# Patient Record
Sex: Male | Born: 1940 | Race: White | Hispanic: No | Marital: Married | Smoking: Never smoker
Health system: Southern US, Community
[De-identification: ages and names within clinical notes are randomized; demographics above are authoritative.]

## PROBLEM LIST (undated history)

## (undated) DIAGNOSIS — G4730 Sleep apnea, unspecified: Secondary | ICD-10-CM

## (undated) DIAGNOSIS — I10 Essential (primary) hypertension: Secondary | ICD-10-CM

## (undated) DIAGNOSIS — M1990 Unspecified osteoarthritis, unspecified site: Secondary | ICD-10-CM

## (undated) HISTORY — PX: HERNIA REPAIR: SHX51

## (undated) HISTORY — PX: INGUINAL HERNIA REPAIR: SUR1180

## (undated) HISTORY — PX: LEG SURGERY: SHX1003

---

## 2018-09-14 ENCOUNTER — Other Ambulatory Visit: Payer: Self-pay | Admitting: Neurosurgery

## 2018-10-18 ENCOUNTER — Other Ambulatory Visit (HOSPITAL_COMMUNITY)
Admission: RE | Admit: 2018-10-18 | Discharge: 2018-10-18 | Disposition: A | Discharge status: Home / Self Care | Payer: Medicare Other | Source: Ambulatory Visit | Attending: Neurosurgery | Admitting: Neurosurgery

## 2018-10-18 ENCOUNTER — Other Ambulatory Visit: Payer: Self-pay

## 2018-10-18 ENCOUNTER — Encounter (HOSPITAL_COMMUNITY): Payer: Self-pay

## 2018-10-18 LAB — SARS CORONAVIRUS 2 (TAT 6-24 HRS): SARS Coronavirus 2: NEGATIVE

## 2018-10-18 NOTE — Anesthesia Preprocedure Evaluation (Addendum)
Anesthesia Evaluation  Patient identified by MRN, date of birth, ID band Patient awake    Reviewed: Allergy & Precautions, NPO status , Patient's Chart, lab work & pertinent test results  Airway Mallampati: II       Dental  (+) Partial Lower   Pulmonary sleep apnea and Continuous Positive Airway Pressure Ventilation ,    Pulmonary exam normal        Cardiovascular hypertension, Pt. on home beta blockers Normal cardiovascular exam     Neuro/Psych negative neurological ROS  negative psych ROS   GI/Hepatic negative GI ROS, Neg liver ROS,   Endo/Other  negative endocrine ROS  Renal/GU negative Renal ROS     Musculoskeletal negative musculoskeletal ROS (+)   Abdominal (+) + obese,   Peds  Hematology negative hematology ROS (+)   Anesthesia Other Findings Lumbar stenosis with neurogenic claudication  Reproductive/Obstetrics                            Anesthesia Physical Anesthesia Plan  ASA: III  Anesthesia Plan: General   Post-op Pain Management:    Induction: Intravenous  PONV Risk Score and Plan: 3 and Ondansetron, Dexamethasone and Treatment may vary due to age or medical condition  Airway Management Planned: Oral ETT  Additional Equipment:   Intra-op Plan:   Post-operative Plan: Extubation in OR  Informed Consent: I have reviewed the patients History and Physical, chart, labs and discussed the procedure including the risks, benefits and alternatives for the proposed anesthesia with the patient or authorized representative who has indicated his/her understanding and acceptance.     Dental advisory given  Plan Discussed with: CRNA  Anesthesia Plan Comments:        Anesthesia Quick Evaluation

## 2018-10-18 NOTE — Progress Notes (Signed)
PCP - Dr. Eber Jones  Cardiologist - Denies  Chest x-ray - Denies  EKG - 10/19/2018 (DOS)  Stress Test - Denies  ECHO - Denies  Cardiac Cath - Denies  AICD-na PM-na LOOP-na  Sleep Study - Yes- Positive CPAP - Yes  LABS- 10/19/2018: CBC, BMP, T/S 10/18/2018: COVID  ASA- LD-9/4  ERAS- NO  Anesthesia- No  Pt denies having chest pain, sob, or fever during the pre-op phone call. All instructions explained to the pt, with a verbal understanding of the material including: as of today, stop taking all Aspirin (unless instructed by your doctor) and Other Aspirin containing products, Vitamins, Fish oils, and Herbal medications. Also stop all NSAIDS i.e. Advil, Ibuprofen, Motrin, Aleve, Anaprox, Naproxen, Celebrex, BC, Goody Powders, and all Supplements. Pt also instructed to self quarantine after being tested for COVID-19. The opportunity to ask questions was provided.   Coronavirus Screening  Have you experienced the following symptoms:  Cough yes/no: No Fever (>100.33F)  yes/no: No Runny nose yes/no: No Sore throat yes/no: No Difficulty breathing/shortness of breath  yes/no: No  Have you or a family member traveled in the last 14 days and where? yes/no: No   If the patient indicates "YES" to the above questions, their PAT will be rescheduled to limit the exposure to others and, the surgeon will be notified. THE PATIENT WILL NEED TO BE ASYMPTOMATIC FOR 14 DAYS.   If the patient is not experiencing any of these symptoms, the PAT nurse will instruct them to NOT bring anyone with them to their appointment since they may have these symptoms or traveled as well.   Please remind your patients and families that hospital visitation restrictions are in effect and the importance of the restrictions.

## 2018-10-19 ENCOUNTER — Inpatient Hospital Stay (HOSPITAL_COMMUNITY): Payer: Medicare Other

## 2018-10-19 ENCOUNTER — Inpatient Hospital Stay (HOSPITAL_COMMUNITY)
Admission: RE | Admit: 2018-10-19 | Discharge: 2018-10-21 | DRG: 455 | Disposition: A | Discharge status: Home / Self Care | Payer: Medicare Other | Attending: Neurosurgery | Admitting: Neurosurgery

## 2018-10-19 ENCOUNTER — Encounter (HOSPITAL_COMMUNITY)
Admission: RE | Disposition: A | Discharge status: Home / Self Care | Payer: Self-pay | Source: Home / Self Care | Attending: Neurosurgery

## 2018-10-19 ENCOUNTER — Other Ambulatory Visit: Payer: Self-pay

## 2018-10-19 ENCOUNTER — Encounter (HOSPITAL_COMMUNITY): Payer: Self-pay | Admitting: *Deleted

## 2018-10-19 ENCOUNTER — Inpatient Hospital Stay (HOSPITAL_COMMUNITY): Payer: Medicare Other | Admitting: Anesthesiology

## 2018-10-19 DIAGNOSIS — M549 Dorsalgia, unspecified: Secondary | ICD-10-CM | POA: Diagnosis present

## 2018-10-19 DIAGNOSIS — G4730 Sleep apnea, unspecified: Secondary | ICD-10-CM | POA: Diagnosis not present

## 2018-10-19 DIAGNOSIS — M4316 Spondylolisthesis, lumbar region: Secondary | ICD-10-CM | POA: Diagnosis not present

## 2018-10-19 DIAGNOSIS — Z20828 Contact with and (suspected) exposure to other viral communicable diseases: Secondary | ICD-10-CM | POA: Diagnosis present

## 2018-10-19 DIAGNOSIS — I10 Essential (primary) hypertension: Secondary | ICD-10-CM | POA: Diagnosis present

## 2018-10-19 DIAGNOSIS — Z419 Encounter for procedure for purposes other than remedying health state, unspecified: Secondary | ICD-10-CM

## 2018-10-19 DIAGNOSIS — M48062 Spinal stenosis, lumbar region with neurogenic claudication: Principal | ICD-10-CM | POA: Diagnosis present

## 2018-10-19 DIAGNOSIS — M5416 Radiculopathy, lumbar region: Secondary | ICD-10-CM | POA: Diagnosis present

## 2018-10-19 HISTORY — DX: Sleep apnea, unspecified: G47.30

## 2018-10-19 HISTORY — DX: Essential (primary) hypertension: I10

## 2018-10-19 HISTORY — DX: Unspecified osteoarthritis, unspecified site: M19.90

## 2018-10-19 LAB — BASIC METABOLIC PANEL
Anion gap: 12 (ref 5–15)
BUN: 27 mg/dL — ABNORMAL HIGH (ref 8–23)
CO2: 20 mmol/L — ABNORMAL LOW (ref 22–32)
Calcium: 8.9 mg/dL (ref 8.9–10.3)
Chloride: 108 mmol/L (ref 98–111)
Creatinine, Ser: 1.16 mg/dL (ref 0.61–1.24)
GFR calc Af Amer: >60 mL/min (ref >60)
GFR calc non Af Amer: 60 mL/min — ABNORMAL LOW (ref >60)
Glucose, Bld: 122 mg/dL — ABNORMAL HIGH (ref 70–99)
Potassium: 3.9 mmol/L (ref 3.5–5.1)
Sodium: 140 mmol/L (ref 135–145)

## 2018-10-19 LAB — TYPE AND SCREEN
ABO/RH(D): O POS
Antibody Screen: NEGATIVE

## 2018-10-19 LAB — CBC
HCT: 51.6 % (ref 39.0–52.0)
Hemoglobin: 16.7 g/dL (ref 13.0–17.0)
MCH: 30.7 pg (ref 26.0–34.0)
MCHC: 32.4 g/dL (ref 30.0–36.0)
MCV: 94.9 fL (ref 80.0–100.0)
Platelets: 120 10*3/uL — ABNORMAL LOW (ref 150–400)
RBC: 5.44 MIL/uL (ref 4.22–5.81)
RDW: 13.8 % (ref 11.5–15.5)
WBC: 5.8 10*3/uL (ref 4.0–10.5)
nRBC: 0 % (ref 0.0–0.2)

## 2018-10-19 LAB — ABO/RH: ABO/RH(D): O POS

## 2018-10-19 SURGERY — POSTERIOR LUMBAR FUSION 1 LEVEL
Anesthesia: General | Site: Spine Lumbar

## 2018-10-19 MED ORDER — ACETAMINOPHEN 500 MG PO TABS: 1000.0000 mg | ORAL_TABLET | Freq: Four times a day (QID) | ORAL | Status: DC | PRN

## 2018-10-19 MED ORDER — PRESERVISION AREDS 2 PO CAPS: 1.0000 | ORAL_CAPSULE | Freq: Every day | ORAL | Status: DC

## 2018-10-19 MED ORDER — OXYCODONE HCL 5 MG PO TABS
5.0000 mg | ORAL_TABLET | ORAL | Status: DC | PRN
  Administered 2018-10-19 – 2018-10-21 (×9): 10 mg via ORAL
  Filled 2018-10-19 (×9): qty 2

## 2018-10-19 MED ORDER — BUPIVACAINE LIPOSOME 1.3 % IJ SUSP
INTRAMUSCULAR | Status: DC | PRN
  Administered 2018-10-19: 20 mL

## 2018-10-19 MED ORDER — ROCURONIUM BROMIDE 10 MG/ML (PF) SYRINGE
PREFILLED_SYRINGE | INTRAVENOUS | Status: AC
  Filled 2018-10-19: qty 10

## 2018-10-19 MED ORDER — EPHEDRINE SULFATE-NACL 50-0.9 MG/10ML-% IV SOSY
PREFILLED_SYRINGE | INTRAVENOUS | Status: DC | PRN
  Administered 2018-10-19: 10 mg via INTRAVENOUS
  Administered 2018-10-19 (×3): 5 mg via INTRAVENOUS

## 2018-10-19 MED ORDER — SODIUM CHLORIDE 0.9% FLUSH
3.0000 mL | Freq: Two times a day (BID) | INTRAVENOUS | Status: DC
  Administered 2018-10-19 (×2): 3 mL via INTRAVENOUS

## 2018-10-19 MED ORDER — FENTANYL CITRATE (PF) 100 MCG/2ML IJ SOLN
INTRAMUSCULAR | Status: AC
  Filled 2018-10-19: qty 2

## 2018-10-19 MED ORDER — KCL IN DEXTROSE-NACL 20-5-0.45 MEQ/L-%-% IV SOLN: INTRAVENOUS | Status: DC

## 2018-10-19 MED ORDER — BUPIVACAINE HCL (PF) 0.5 % IJ SOLN
INTRAMUSCULAR | Status: AC
  Filled 2018-10-19: qty 30

## 2018-10-19 MED ORDER — PROSIGHT PO TABS
1.0000 | ORAL_TABLET | Freq: Every day | ORAL | Status: DC
  Administered 2018-10-20: 1 via ORAL
  Filled 2018-10-19 (×2): qty 1

## 2018-10-19 MED ORDER — ONDANSETRON HCL 4 MG PO TABS: 4.0000 mg | ORAL_TABLET | Freq: Four times a day (QID) | ORAL | Status: DC | PRN

## 2018-10-19 MED ORDER — FENTANYL CITRATE (PF) 250 MCG/5ML IJ SOLN
INTRAMUSCULAR | Status: AC
  Filled 2018-10-19: qty 5

## 2018-10-19 MED ORDER — THROMBIN 5000 UNITS EX SOLR
OROMUCOSAL | Status: DC | PRN
  Administered 2018-10-19: 09:00:00 5 mL via TOPICAL

## 2018-10-19 MED ORDER — ASPIRIN 81 MG PO CHEW
81.0000 mg | CHEWABLE_TABLET | Freq: Every day | ORAL | Status: DC
  Administered 2018-10-20: 81 mg via ORAL
  Filled 2018-10-19: qty 1

## 2018-10-19 MED ORDER — THROMBIN 20000 UNITS EX SOLR
CUTANEOUS | Status: AC
  Filled 2018-10-19: qty 20000

## 2018-10-19 MED ORDER — TESTOSTERONE CYPIONATE 200 MG/ML IJ SOLN: 100.0000 mg | INTRAMUSCULAR | Status: DC

## 2018-10-19 MED ORDER — FLEET ENEMA 7-19 GM/118ML RE ENEM: 1.0000 | ENEMA | Freq: Once | RECTAL | Status: DC | PRN

## 2018-10-19 MED ORDER — ZOLPIDEM TARTRATE 5 MG PO TABS: 5.0000 mg | ORAL_TABLET | Freq: Every evening | ORAL | Status: DC | PRN

## 2018-10-19 MED ORDER — BUPIVACAINE HCL (PF) 0.5 % IJ SOLN
INTRAMUSCULAR | Status: DC | PRN
  Administered 2018-10-19: 5 mL

## 2018-10-19 MED ORDER — BUPIVACAINE LIPOSOME 1.3 % IJ SUSP
20.0000 mL | Freq: Once | INTRAMUSCULAR | Status: DC
  Filled 2018-10-19: qty 20

## 2018-10-19 MED ORDER — ADULT MULTIVITAMIN W/MINERALS CH: 1.0000 | ORAL_TABLET | Freq: Every day | ORAL | Status: DC

## 2018-10-19 MED ORDER — ACETAMINOPHEN 500 MG PO TABS
1000.0000 mg | ORAL_TABLET | Freq: Once | ORAL | Status: AC
  Administered 2018-10-19: 1000 mg via ORAL
  Filled 2018-10-19: qty 2

## 2018-10-19 MED ORDER — METHOCARBAMOL 1000 MG/10ML IJ SOLN
500.0000 mg | Freq: Four times a day (QID) | INTRAVENOUS | Status: DC | PRN
  Filled 2018-10-19: qty 5

## 2018-10-19 MED ORDER — ROCURONIUM BROMIDE 10 MG/ML (PF) SYRINGE
PREFILLED_SYRINGE | INTRAVENOUS | Status: DC | PRN
  Administered 2018-10-19: 50 mg via INTRAVENOUS
  Administered 2018-10-19: 30 mg via INTRAVENOUS

## 2018-10-19 MED ORDER — METHOCARBAMOL 500 MG PO TABS
500.0000 mg | ORAL_TABLET | Freq: Four times a day (QID) | ORAL | Status: DC | PRN
  Administered 2018-10-19 – 2018-10-21 (×6): 500 mg via ORAL
  Filled 2018-10-19 (×5): qty 1

## 2018-10-19 MED ORDER — CEFAZOLIN SODIUM-DEXTROSE 2-4 GM/100ML-% IV SOLN
2.0000 g | Freq: Three times a day (TID) | INTRAVENOUS | Status: AC
  Administered 2018-10-19 (×2): 2 g via INTRAVENOUS
  Filled 2018-10-19 (×2): qty 100

## 2018-10-19 MED ORDER — CEFAZOLIN SODIUM-DEXTROSE 2-4 GM/100ML-% IV SOLN
2.0000 g | INTRAVENOUS | Status: AC
  Administered 2018-10-19: 2 g via INTRAVENOUS
  Filled 2018-10-19: qty 100

## 2018-10-19 MED ORDER — SODIUM CHLORIDE 0.9 % IV SOLN
INTRAVENOUS | Status: DC | PRN
  Administered 2018-10-19: 50 ug/min via INTRAVENOUS

## 2018-10-19 MED ORDER — SUGAMMADEX SODIUM 200 MG/2ML IV SOLN
INTRAVENOUS | Status: DC | PRN
  Administered 2018-10-19: 200 mg via INTRAVENOUS

## 2018-10-19 MED ORDER — ONDANSETRON HCL 4 MG/2ML IJ SOLN
INTRAMUSCULAR | Status: AC
  Filled 2018-10-19: qty 2

## 2018-10-19 MED ORDER — TAMSULOSIN HCL 0.4 MG PO CAPS
0.4000 mg | ORAL_CAPSULE | Freq: Every day | ORAL | Status: DC
  Administered 2018-10-19 – 2018-10-20 (×2): 0.4 mg via ORAL
  Filled 2018-10-19 (×2): qty 1

## 2018-10-19 MED ORDER — LIDOCAINE-EPINEPHRINE 1 %-1:100000 IJ SOLN
INTRAMUSCULAR | Status: DC | PRN
  Administered 2018-10-19: 5 mL

## 2018-10-19 MED ORDER — POLYETHYLENE GLYCOL 3350 17 G PO PACK: 17.0000 g | PACK | Freq: Every day | ORAL | Status: DC | PRN

## 2018-10-19 MED ORDER — ONDANSETRON HCL 4 MG/2ML IJ SOLN
INTRAMUSCULAR | Status: DC | PRN
  Administered 2018-10-19: 4 mg via INTRAVENOUS

## 2018-10-19 MED ORDER — HYDROCODONE-ACETAMINOPHEN 5-325 MG PO TABS
2.0000 | ORAL_TABLET | ORAL | Status: DC | PRN
  Administered 2018-10-19: 2 via ORAL
  Filled 2018-10-19: qty 2

## 2018-10-19 MED ORDER — MENTHOL 3 MG MT LOZG: 1.0000 | LOZENGE | OROMUCOSAL | Status: DC | PRN

## 2018-10-19 MED ORDER — THROMBIN 20000 UNITS EX SOLR
CUTANEOUS | Status: DC | PRN
  Administered 2018-10-19: 20 mL via TOPICAL

## 2018-10-19 MED ORDER — SODIUM CHLORIDE 0.9 % IV SOLN
250.0000 mL | INTRAVENOUS | Status: DC
  Administered 2018-10-19: 16:00:00 250 mL via INTRAVENOUS

## 2018-10-19 MED ORDER — DEXAMETHASONE SODIUM PHOSPHATE 10 MG/ML IJ SOLN
INTRAMUSCULAR | Status: AC
  Filled 2018-10-19: qty 1

## 2018-10-19 MED ORDER — ALUM & MAG HYDROXIDE-SIMETH 200-200-20 MG/5ML PO SUSP: 30.0000 mL | Freq: Four times a day (QID) | ORAL | Status: DC | PRN

## 2018-10-19 MED ORDER — DOCUSATE SODIUM 100 MG PO CAPS
100.0000 mg | ORAL_CAPSULE | Freq: Two times a day (BID) | ORAL | Status: DC
  Administered 2018-10-19 – 2018-10-20 (×3): 100 mg via ORAL
  Filled 2018-10-19 (×3): qty 1

## 2018-10-19 MED ORDER — VITAMIN D (ERGOCALCIFEROL) 1.25 MG (50000 UNIT) PO CAPS: 50000.0000 [IU] | ORAL_CAPSULE | ORAL | Status: DC

## 2018-10-19 MED ORDER — DEXAMETHASONE SODIUM PHOSPHATE 10 MG/ML IJ SOLN
INTRAMUSCULAR | Status: DC | PRN
  Administered 2018-10-19: 10 mg via INTRAVENOUS

## 2018-10-19 MED ORDER — LIDOCAINE-EPINEPHRINE 1 %-1:100000 IJ SOLN
INTRAMUSCULAR | Status: AC
  Filled 2018-10-19: qty 1

## 2018-10-19 MED ORDER — LACTATED RINGERS IV SOLN
INTRAVENOUS | Status: DC | PRN
  Administered 2018-10-19 (×2): via INTRAVENOUS

## 2018-10-19 MED ORDER — PANTOPRAZOLE SODIUM 40 MG IV SOLR: 40.0000 mg | Freq: Every day | INTRAVENOUS | Status: DC

## 2018-10-19 MED ORDER — ONDANSETRON HCL 4 MG/2ML IJ SOLN: 4.0000 mg | Freq: Once | INTRAMUSCULAR | Status: DC | PRN

## 2018-10-19 MED ORDER — OXYCODONE HCL 5 MG PO TABS
5.0000 mg | ORAL_TABLET | ORAL | Status: DC | PRN
  Administered 2018-10-19: 12:00:00 5 mg via ORAL

## 2018-10-19 MED ORDER — PANTOPRAZOLE SODIUM 40 MG PO TBEC
40.0000 mg | DELAYED_RELEASE_TABLET | Freq: Every day | ORAL | Status: DC
  Administered 2018-10-19 – 2018-10-20 (×2): 40 mg via ORAL
  Filled 2018-10-19 (×2): qty 1

## 2018-10-19 MED ORDER — OXYCODONE HCL 5 MG PO TABS
ORAL_TABLET | ORAL | Status: AC
  Filled 2018-10-19: qty 1

## 2018-10-19 MED ORDER — PHENOL 1.4 % MT LIQD: 1.0000 | OROMUCOSAL | Status: DC | PRN

## 2018-10-19 MED ORDER — ACETAMINOPHEN 325 MG PO TABS: 650.0000 mg | ORAL_TABLET | ORAL | Status: DC | PRN

## 2018-10-19 MED ORDER — PROPOFOL 10 MG/ML IV BOLUS
INTRAVENOUS | Status: AC
  Filled 2018-10-19: qty 20

## 2018-10-19 MED ORDER — BISACODYL 10 MG RE SUPP: 10.0000 mg | Freq: Every day | RECTAL | Status: DC | PRN

## 2018-10-19 MED ORDER — CHLORHEXIDINE GLUCONATE CLOTH 2 % EX PADS: 6.0000 | MEDICATED_PAD | Freq: Once | CUTANEOUS | Status: DC

## 2018-10-19 MED ORDER — SODIUM CHLORIDE 0.9% FLUSH: 3.0000 mL | INTRAVENOUS | Status: DC | PRN

## 2018-10-19 MED ORDER — METOPROLOL TARTRATE 25 MG PO TABS
100.0000 mg | ORAL_TABLET | Freq: Two times a day (BID) | ORAL | Status: DC
  Administered 2018-10-19 – 2018-10-20 (×3): 100 mg via ORAL
  Filled 2018-10-19 (×2): qty 4

## 2018-10-19 MED ORDER — ONDANSETRON HCL 4 MG/2ML IJ SOLN: 4.0000 mg | Freq: Four times a day (QID) | INTRAMUSCULAR | Status: DC | PRN

## 2018-10-19 MED ORDER — HYDROCODONE-ACETAMINOPHEN 5-325 MG PO TABS: 1.0000 | ORAL_TABLET | ORAL | Status: DC | PRN

## 2018-10-19 MED ORDER — PROPOFOL 10 MG/ML IV BOLUS
INTRAVENOUS | Status: DC | PRN
  Administered 2018-10-19: 150 mg via INTRAVENOUS

## 2018-10-19 MED ORDER — ACETAMINOPHEN 650 MG RE SUPP: 650.0000 mg | RECTAL | Status: DC | PRN

## 2018-10-19 MED ORDER — FENTANYL CITRATE (PF) 100 MCG/2ML IJ SOLN
25.0000 ug | INTRAMUSCULAR | Status: DC | PRN
  Administered 2018-10-19: 25 ug via INTRAVENOUS

## 2018-10-19 MED ORDER — METHOCARBAMOL 500 MG PO TABS
ORAL_TABLET | ORAL | Status: AC
  Filled 2018-10-19: qty 1

## 2018-10-19 MED ORDER — 0.9 % SODIUM CHLORIDE (POUR BTL) OPTIME
TOPICAL | Status: DC | PRN
  Administered 2018-10-19: 1000 mL

## 2018-10-19 MED ORDER — FENTANYL CITRATE (PF) 100 MCG/2ML IJ SOLN
INTRAMUSCULAR | Status: DC | PRN
  Administered 2018-10-19 (×2): 50 ug via INTRAVENOUS
  Administered 2018-10-19: 100 ug via INTRAVENOUS
  Administered 2018-10-19: 50 ug via INTRAVENOUS

## 2018-10-19 MED ORDER — LIDOCAINE 2% (20 MG/ML) 5 ML SYRINGE
INTRAMUSCULAR | Status: AC
  Filled 2018-10-19: qty 5

## 2018-10-19 MED ORDER — MORPHINE SULFATE (PF) 2 MG/ML IV SOLN
2.0000 mg | INTRAVENOUS | Status: DC | PRN
  Administered 2018-10-19: 2 mg via INTRAVENOUS
  Filled 2018-10-19: qty 1

## 2018-10-19 MED ORDER — LIDOCAINE 2% (20 MG/ML) 5 ML SYRINGE
INTRAMUSCULAR | Status: DC | PRN
  Administered 2018-10-19: 60 mg via INTRAVENOUS

## 2018-10-19 SURGICAL SUPPLY — 77 items
BASKET BONE COLLECTION (BASKET) ×2 IMPLANT
BLADE CLIPPER SURG (BLADE) ×2 IMPLANT
BONE CANC CHIPS 20CC PCAN1/4 (Bone Implant) ×2 IMPLANT
BUR MATCHSTICK NEURO 3.0 LAGG (BURR) ×2 IMPLANT
BUR PRECISION FLUTE 5.0 (BURR) ×2 IMPLANT
CAGE COROENT LG 10X9X23-12 (Cage) ×2 IMPLANT
CANISTER SUCT 3000ML PPV (MISCELLANEOUS) ×2 IMPLANT
CARTRIDGE OIL MAESTRO DRILL (MISCELLANEOUS) ×1 IMPLANT
CHIPS CANC BONE 20CC PCAN1/4 (Bone Implant) ×1 IMPLANT
CONT SPEC 4OZ CLIKSEAL STRL BL (MISCELLANEOUS) ×2 IMPLANT
COVER BACK TABLE 60X90IN (DRAPES) ×2 IMPLANT
COVER WAND RF STERILE (DRAPES) IMPLANT
DECANTER SPIKE VIAL GLASS SM (MISCELLANEOUS) ×2 IMPLANT
DERMABOND ADVANCED (GAUZE/BANDAGES/DRESSINGS) ×1
DERMABOND ADVANCED .7 DNX12 (GAUZE/BANDAGES/DRESSINGS) ×1 IMPLANT
DIFFUSER DRILL AIR PNEUMATIC (MISCELLANEOUS) ×2 IMPLANT
DRAPE C-ARM 42X72 X-RAY (DRAPES) ×2 IMPLANT
DRAPE C-ARMOR (DRAPES) ×2 IMPLANT
DRAPE LAPAROTOMY 100X72X124 (DRAPES) ×2 IMPLANT
DRAPE SURG 17X23 STRL (DRAPES) ×2 IMPLANT
DRSG OPSITE POSTOP 4X6 (GAUZE/BANDAGES/DRESSINGS) ×2 IMPLANT
DURAPREP 26ML APPLICATOR (WOUND CARE) ×2 IMPLANT
ELECT BLADE 4.0 EZ CLEAN MEGAD (MISCELLANEOUS) ×2
ELECT REM PT RETURN 9FT ADLT (ELECTROSURGICAL) ×2
ELECTRODE BLDE 4.0 EZ CLN MEGD (MISCELLANEOUS) ×1 IMPLANT
ELECTRODE REM PT RTRN 9FT ADLT (ELECTROSURGICAL) ×1 IMPLANT
EVACUATOR 1/8 PVC DRAIN (DRAIN) IMPLANT
GAUZE 4X4 16PLY RFD (DISPOSABLE) IMPLANT
GAUZE SPONGE 4X4 12PLY STRL (GAUZE/BANDAGES/DRESSINGS) IMPLANT
GLOVE BIO SURGEON STRL SZ7 (GLOVE) ×4 IMPLANT
GLOVE BIO SURGEON STRL SZ8 (GLOVE) ×4 IMPLANT
GLOVE BIOGEL PI IND STRL 7.5 (GLOVE) ×2 IMPLANT
GLOVE BIOGEL PI IND STRL 8 (GLOVE) ×2 IMPLANT
GLOVE BIOGEL PI IND STRL 8.5 (GLOVE) ×2 IMPLANT
GLOVE BIOGEL PI INDICATOR 7.5 (GLOVE) ×2
GLOVE BIOGEL PI INDICATOR 8 (GLOVE) ×2
GLOVE BIOGEL PI INDICATOR 8.5 (GLOVE) ×2
GLOVE ECLIPSE 8.0 STRL XLNG CF (GLOVE) ×4 IMPLANT
GLOVE EXAM NITRILE XL STR (GLOVE) IMPLANT
GLOVE SURG SS PI 7.0 STRL IVOR (GLOVE) ×10 IMPLANT
GOWN STRL REUS W/ TWL LRG LVL3 (GOWN DISPOSABLE) IMPLANT
GOWN STRL REUS W/ TWL XL LVL3 (GOWN DISPOSABLE) ×4 IMPLANT
GOWN STRL REUS W/TWL 2XL LVL3 (GOWN DISPOSABLE) ×4 IMPLANT
GOWN STRL REUS W/TWL LRG LVL3 (GOWN DISPOSABLE)
GOWN STRL REUS W/TWL XL LVL3 (GOWN DISPOSABLE) ×4
HEMOSTAT POWDER KIT SURGIFOAM (HEMOSTASIS) ×2 IMPLANT
KIT BASIN OR (CUSTOM PROCEDURE TRAY) ×2 IMPLANT
KIT POSITION SURG JACKSON T1 (MISCELLANEOUS) ×2 IMPLANT
KIT TURNOVER KIT B (KITS) ×2 IMPLANT
MILL MEDIUM DISP (BLADE) ×2 IMPLANT
NEEDLE HYPO 21X1.5 SAFETY (NEEDLE) ×2 IMPLANT
NEEDLE HYPO 25X1 1.5 SAFETY (NEEDLE) ×2 IMPLANT
NEEDLE SPNL 18GX3.5 QUINCKE PK (NEEDLE) ×2 IMPLANT
NS IRRIG 1000ML POUR BTL (IV SOLUTION) ×2 IMPLANT
OIL CARTRIDGE MAESTRO DRILL (MISCELLANEOUS) ×2
PACK LAMINECTOMY NEURO (CUSTOM PROCEDURE TRAY) ×2 IMPLANT
PAD ARMBOARD 7.5X6 YLW CONV (MISCELLANEOUS) ×8 IMPLANT
PATTIES SURGICAL .5 X.5 (GAUZE/BANDAGES/DRESSINGS) IMPLANT
PATTIES SURGICAL .5 X1 (DISPOSABLE) IMPLANT
PATTIES SURGICAL 1X1 (DISPOSABLE) IMPLANT
ROD RELINE-O LORD 5.5X40 (Rod) ×4 IMPLANT
SCREW LOCK RELINE 5.5 TULIP (Screw) ×8 IMPLANT
SCREW RELINE-O POLY 6.5X45 (Screw) ×4 IMPLANT
SCREW RELINE-O POLY 6.5X50MM (Screw) ×4 IMPLANT
SPONGE LAP 4X18 RFD (DISPOSABLE) IMPLANT
SPONGE SURGIFOAM ABS GEL 100 (HEMOSTASIS) ×2 IMPLANT
STAPLER SKIN PROX WIDE 3.9 (STAPLE) IMPLANT
SUT VIC AB 1 CT1 18XBRD ANBCTR (SUTURE) ×1 IMPLANT
SUT VIC AB 1 CT1 8-18 (SUTURE) ×1
SUT VIC AB 2-0 CT1 18 (SUTURE) ×2 IMPLANT
SUT VIC AB 3-0 SH 8-18 (SUTURE) ×4 IMPLANT
SYR 20ML LL LF (SYRINGE) ×2 IMPLANT
SYR 5ML LL (SYRINGE) IMPLANT
TOWEL GREEN STERILE (TOWEL DISPOSABLE) ×2 IMPLANT
TOWEL GREEN STERILE FF (TOWEL DISPOSABLE) ×2 IMPLANT
TRAY FOLEY MTR SLVR 16FR STAT (SET/KITS/TRAYS/PACK) ×2 IMPLANT
WATER STERILE IRR 1000ML POUR (IV SOLUTION) ×2 IMPLANT

## 2018-10-19 NOTE — Op Note (Signed)
10/19/2018  10:58 AM  PATIENT:  Spencer Clark  78 y.o. male  PRE-OPERATIVE DIAGNOSIS:  Lumbar stenosis with neurogenic claudication, spondylolisthesis, lumbago, radiculopathy L 45 level  POST-OPERATIVE DIAGNOSIS:  Lumbar stenosis with neurogenic claudication, spondylolisthesis, lumbago, radiculopathy L 45 level   PROCEDURE:  Procedure(s) with comments: Lumbar Four-Five Posterior lumbar interbody fusion (N/A) - posterior with PEEK cages, pedicle screw fixation, posterolateral arthrodesis  SURGEON:  Surgeon(s) and Role:    Erline Levine, MD - Primary    * Consuella Lose, MD - Assisting  PHYSICIAN ASSISTANT:   ASSISTANTS: Poteat, RN   ANESTHESIA:   general  EBL:  500 mL   BLOOD ADMINISTERED:none  DRAINS: none   LOCAL MEDICATIONS USED:  MARCAINE    and LIDOCAINE   SPECIMEN:  No Specimen  DISPOSITION OF SPECIMEN:  N/A  COUNTS:  YES  TOURNIQUET:  * No tourniquets in log *  DICTATION: Patient is 78 year old man with 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 He has a severe bilateral L5 radiculopathy. It was elected to take him to surgery for decompression and fusion at this level.   Procedure: Patient was placed in a prone position on the Opal table after smooth and uncomplicated induction of general endotracheal anesthesia. Preop C -arm was used to mark spinal anatomy.  His low back was prepped and draped in usual sterile fashion with betadine scrub and DuraPrep. Area of incision was infiltrated with local lidocaine. Incision was made to the lumbodorsal fascia was incised and exposure was performed of the L4 through L5 spinous processes laminae facet joint and transverse processes. Intraoperative x-ray was obtained which confirmed correct orientation. A total laminectomy of L4 was performed with disarticulation of the facet joints at this level and thorough decompression was performed of both L4 and L5 nerve roots along with the common dural tube. This  decompression was more involved than would be typical of that performed for PLIF alone and included painstaking dissection of adherent ligament compressing the thecal sac and wide decompression of all neural elements. A thorough discectomy was initially performed on the left with preparation of the endplates for grafting a trial spacer was placed this level and a thorough discectomy was performed on the right as well. Bone autograft was packed within the interspace bilaterally. Bilateral median 10 x 23 x 12 degree  peek cages were packed with bone autograft and was inserted the interspace and countersunk appropriately along with 10 cc of morselized bone autograft. The posterolateral region was extensively decorticated and pedicle probes were placed at L4 and L5 bilaterally. Intraoperative fluoroscopy confirmed correct orientationin the AP and lateral plane. 45 x 6.5 mm pedicle screws were placed at L5 bilaterally and 50 x 6.5 mm screws placed at L4 bilaterally final x-rays demonstrated well-positioned interbody grafts and pedicle screw fixation. A 40 mm lordotic rod was placed on the right and a 40 mm rod was placed on the left locked down in situ and the posterolateral region was packed with the remaining autograft and allograft (20 cc bilaterally). The wound was irrigated. Long-acting Marcaine was injected in the subcutaneous tissues. Fascia was closed with 1 Vicryl sutures skin edges were reapproximated 2 and 3-0 Vicryl sutures. The wound is dressed with Dermabond and the patient was extubated in the operating room and taken to recovery in stable satisfactory condition. He tolerated the operation well counts were correct at the end of the case.   PLAN OF CARE: Admit to inpatient   PATIENT DISPOSITION:  PACU -  hemodynamically stable.   Delay start of Pharmacological VTE agent (>24hrs) due to surgical blood loss or risk of bleeding: yes

## 2018-10-19 NOTE — H&P (Signed)
Patient ID:   [id number] Patient: Spencer Clark  Date of Birth: March 13, 1940 Visit Type: Office Visit   Date: 09/13/2018 01:00 PM Provider: Danae OrleansJoseph D. Venetia MaxonStern MD   This 78 year old male presents for back pain.  HISTORY OF PRESENT ILLNESS:  1.  back pain  Spencer Clark, 78 year old male, husband of patient Spencer Clark, visits for evaluation of back and right leg pain.  Patient recalls no injury, noting onset of back pain 2017. January 2020, he noticed increased low back pain and right leg pain with walking by June.  Sitting allows some relief of leg pain.  Patient complains of pain in the back of his right leg and right side of his lumbar. Patient reports his pain does not radiate into his foot. He repots he can walk 50 years before he is in pain. Patient reports no problems in his left leg. Patient has an unsteady gait. Patient does not complain of numbness or pain in his thighs. Patient reports his pain has worsened over the past few months.   Tylenol 1000 mg b.i.d. Celebrex 200 mg b.i.d. x10 years  History:  HTN, arthritis, low testosterone Surgical history:  Right lower leg fracture 1995  MRI 05/04/2018 and x-rays today on [facility]          PAST MEDICAL/SURGICAL HISTORY:   (Detailed)       Family History:  (Detailed)   Social History:  (Detailed) Preferred language is English.       MEDICATIONS: (added, continued or stopped this visit)   ALLERGIES:   REVIEW OF SYSTEMS   See scanned patient registration form, dated, signed and dated on   Review of Systems Details System Neg/Pos Details  Constitutional Negative Chills, Fatigue, Fever, Malaise, Night sweats, Weight gain and Weight loss.  ENMT Negative Ear drainage, Hearing loss, Nasal drainage, Otalgia, Sinus pressure and Sore throat.  Eyes Negative Eye discharge, Eye pain and Vision changes.  Respiratory Negative Chronic cough, Cough, Dyspnea, Known TB exposure and Wheezing.  Cardio Negative Chest pain,  Claudication, Edema and Irregular heartbeat/palpitations.  GI Negative Abdominal pain, Blood in stool, Change in stool pattern, Constipation, Decreased appetite, Diarrhea, Heartburn, Nausea and Vomiting.  GU Negative Dribbling, Dysuria, Erectile dysfunction, Hematuria, Polyuria (Genitourinary), Slow stream, Urinary frequency, Urinary incontinence and Urinary retention.  Endocrine Negative Cold intolerance, Heat intolerance, Polydipsia and Polyphagia.  Neuro Negative Dizziness, Extremity weakness, Gait disturbance, Headache, Memory impairment, Numbness in extremity, Seizures and Tremors.  Psych Negative Anxiety, Depression and Insomnia.  Integumentary Negative Brittle hair, Brittle nails, Change in shape/size of mole(s), Hair loss, Hirsutism, Hives, Pruritus, Rash and Skin lesion.  MS Positive Back pain, RLE pain.  Hema/Lymph Negative Easy bleeding, Easy bruising and Lymphadenopathy.  Allergic/Immuno Negative Contact allergy, Environmental allergies, Food allergies and Seasonal allergies.  Reproductive Negative Penile discharge and Sexual dysfunction.   PHYSICAL EXAM:   Vitals Date Temp F BP Pulse Ht In Wt Lb BMI BSA Pain Score  09/13/2018 97.5 186/108 69 66 236 38.09  10/10    PHYSICAL EXAM Details General Level of Distress: no acute distress Overall Appearance: normal  Head and Face  Right Left  Fundoscopic Exam:  normal normal    Cardiovascular Cardiac: regular rate and rhythm without murmur  Right Left  Carotid Pulses: normal normal  Respiratory Lungs: clear to auscultation  Neurological Orientation: normal Recent and Remote Memory: normal Attention Span and Concentration:   normal Language: normal Fund of Knowledge: normal  Right Left Sensation: normal normal Upper Extremity Coordination: normal normal  Lower Extremity Coordination: normal normal  Musculoskeletal Gait and Station: normal  Right Left Upper Extremity Muscle Strength: normal normal Lower  Extremity Muscle Strength: normal normal Upper Extremity Muscle Tone:  normal normal Lower Extremity Muscle Tone: normal normal   Motor Strength Upper and lower extremity motor strength was tested in the clinically pertinent muscles. Any abnormal findings will be noted below.   Right Left EHL: 4/5    Deep Tendon Reflexes  Right Left Biceps: normal normal Triceps: normal normal Brachioradialis: normal normal Patellar: normal normal Achilles: normal normal  Sensory Sensation was tested at L1 to S1. Any abnormal findings will be noted below.  Right Left L4: decreased   L5: decreased    Cranial Nerves II. Optic Nerve/Visual Fields: normal III. Oculomotor: normal IV. Trochlear: normal V. Trigeminal: normal VI. Abducens: normal VII. Facial: normal VIII. Acoustic/Vestibular: normal IX. Glossopharyngeal: normal X. Vagus: normal XI. Spinal Accessory: normal XII. Hypoglossal: normal  Motor and other Tests Lhermittes: negative Rhomberg: negative Pronator drift: absent     Right Left Hoffman's: normal normal Clonus: normal normal Babinski: normal normal SLR: positive at 30 degrees negative Patrick's Corky Sox): negative negative Toe Walk: normal normal Toe Lift: normal normal Heel Walk: normal normal SI Joint: nontender nontender     DIAGNOSTIC RESULTS:   Neutral L4-5 10 mm Extension L4-5 9 mm Flexion L4-5 13.3 mm     IMPRESSION:   Encouraged patient to try to lose weight. Recommended fusion at L4-5. Patient has severe spinal stenosis at L4-5. Upon examination, patient's left shoulder slopes lower than his right. Patient can stand on his heels and toes. Patient feels week squatting on his right leg. Patient has good strength in upper bilateral extremities. Patient has 4/5 right EHL weakness. Strength in left foot is normal. Negative SLR on the left. Positive SLR at 30 degrees on the right. Patient's pain originates from his lower back and radiates into his right  leg only while walking. Decreased L5 pin sensitivity on the right. Decreased L4 pin sensitivity on the right.   PLAN:  1) L4-L5 decompression and fusion   Orders: Diagnostic Procedures: Assessment Procedure  M54.16 Lumbar Spine- AP/Lat  M54.16 Lumbar Spine- AP/Lat/Flex/Ex  Miscellaneous: Assessment   M48.062 LSO Brace   Completed Orders (this encounter) Order Details Reason Side Interpretation Result Initial Treatment Date Region  Lumbar Spine- AP/Lat/Flex/Ex      09/13/2018 All Levels to All Levels   Assessment/Plan   # Detail Type Description   1. Assessment Lumbar radiculopathy (M54.16).       2. Assessment Lumbar stenosis with neurogenic claudication (M48.062).   Plan Orders LSO Brace.                     Provider:  Marchia Meiers. Vertell Limber MD  09/16/2018 01:50 PM    Dictation edited by: Judd Gaudier    CC Providers: Erline Levine MD  [address] [place], [place] [address]               Electronically signed by Marchia Meiers. Vertell Limber MD on 09/16/2018 01:50 PM

## 2018-10-19 NOTE — Anesthesia Procedure Notes (Addendum)
Procedure Name: Intubation Date/Time: 10/19/2018 7:44 AM Performed by: Kyung Rudd, CRNA Pre-anesthesia Checklist: Patient identified, Emergency Drugs available, Suction available, Patient being monitored and Timeout performed Patient Re-evaluated:Patient Re-evaluated prior to induction Oxygen Delivery Method: Circle system utilized Preoxygenation: Pre-oxygenation with 100% oxygen Induction Type: IV induction Ventilation: Mask ventilation without difficulty Laryngoscope Size: Glidescope and 4 Tube type: Oral Tube size: 7.5 mm Number of attempts: 1 Airway Equipment and Method: Video-laryngoscopy and Stylet Placement Confirmation: ETT inserted through vocal cords under direct vision,  positive ETCO2 and breath sounds checked- equal and bilateral Secured at: 21 cm Tube secured with: Tape Dental Injury: Teeth and Oropharynx as per pre-operative assessment  Comments: Glidescope electively used. Grade I view on screen.

## 2018-10-19 NOTE — Progress Notes (Signed)
Patient ID: Spencer Clark, male   DOB: [birth year], 78 y.o.   MRN: [id number] Alert, reporting only lumbar pain with position changes. No leg pain or numbness. Good strength BLE. Doing well. Wife present and appropriately attentive.

## 2018-10-19 NOTE — Progress Notes (Signed)
Care of pt assumed by MA Daryana Whirley RN 

## 2018-10-19 NOTE — Anesthesia Postprocedure Evaluation (Signed)
Anesthesia Post Note  Patient: Spencer Clark  Procedure(s) Performed: Lumbar Four-Five Posterior lumbar interbody fusion (N/A Spine Lumbar)     Patient location during evaluation: PACU Anesthesia Type: General Level of consciousness: awake Pain management: pain level controlled Vital Signs Assessment: post-procedure vital signs reviewed and stable Respiratory status: spontaneous breathing, nonlabored ventilation, respiratory function stable and patient connected to nasal cannula oxygen Cardiovascular status: blood pressure returned to baseline and stable Postop Assessment: no apparent nausea or vomiting Anesthetic complications: no    Last Vitals:  Vitals:   10/19/18 1344 10/19/18 1645  BP: (!) 197/98 (!) 156/88  Pulse: 62 87  Resp: 17 16  Temp:  36.6 C  SpO2:  95%    Last Pain:  Vitals:   10/19/18 1645  TempSrc: Oral  PainSc:                  Mercedies Ganesh P Ernesta Trabert

## 2018-10-19 NOTE — Transfer of Care (Signed)
Immediate Anesthesia Transfer of Care Note  Patient: Spencer Clark  Procedure(s) Performed: Lumbar Four-Five Posterior lumbar interbody fusion (N/A Spine Lumbar)  Patient Location: PACU  Anesthesia Type:General  Level of Consciousness: drowsy  Airway & Oxygen Therapy: Patient Spontanous Breathing and Patient connected to nasal cannula oxygen  Post-op Assessment: Report given to RN, Post -op Vital signs reviewed and stable and Patient moving all extremities X 4  Post vital signs: Reviewed and stable  Last Vitals:  Vitals Value Taken Time  BP 174/93 10/19/18 1117  Temp 36.3 C 10/19/18 1115  Pulse 71 10/19/18 1121  Resp 23 10/19/18 1121  SpO2 93 % 10/19/18 1121  Vitals shown include unvalidated device data.  Last Pain:  Vitals:   10/19/18 0702  TempSrc: Oral  PainSc:       Patients Stated Pain Goal: 4 (11/55/20 8022)  Complications: No apparent anesthesia complications

## 2018-10-19 NOTE — Interval H&P Note (Signed)
History and Physical Interval Note:  10/19/2018 7:27 AM  Spencer Clark  has presented today for surgery, with the diagnosis of Lumbar stenosis with neurogenic claudication.  The various methods of treatment have been discussed with the patient and family. After consideration of risks, benefits and other options for treatment, the patient has consented to  Procedure(s) with comments: Lumbar 4-5 Posterior lumbar interbody fusion (N/A) - Lumbar 4-5 Posterior lumbar interbody fusion as a surgical intervention.  The patient's history has been reviewed, patient examined, no change in status, stable for surgery.  I have reviewed the patient's chart and labs.  Questions were answered to the patient's satisfaction.     [name]

## 2018-10-19 NOTE — Brief Op Note (Signed)
10/19/2018  10:58 AM  PATIENT:  Spencer Clark  78 y.o. male  PRE-OPERATIVE DIAGNOSIS:  Lumbar stenosis with neurogenic claudication, spondylolisthesis, lumbago, radiculopathy L 45 level  POST-OPERATIVE DIAGNOSIS:  Lumbar stenosis with neurogenic claudication, spondylolisthesis, lumbago, radiculopathy L 45 level   PROCEDURE:  Procedure(s) with comments: Lumbar Four-Five Posterior lumbar interbody fusion (N/A) - posterior with PEEK cages, pedicle screw fixation, posterolateral arthrodesis  SURGEON:  Surgeon(s) and Role:    * Deetra Booton, MD - Primary    * Nundkumar, Neelesh, MD - Assisting  PHYSICIAN ASSISTANT:   ASSISTANTS: Poteat, RN   ANESTHESIA:   general  EBL:  500 mL   BLOOD ADMINISTERED:none  DRAINS: none   LOCAL MEDICATIONS USED:  MARCAINE    and LIDOCAINE   SPECIMEN:  No Specimen  DISPOSITION OF SPECIMEN:  N/A  COUNTS:  YES  TOURNIQUET:  * No tourniquets in log *  DICTATION: Patient is 78-year-old man with 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 He has a severe bilateral L5 radiculopathy. It was elected to take him to surgery for decompression and fusion at this level.   Procedure: Patient was placed in a prone position on the Jackson table after smooth and uncomplicated induction of general endotracheal anesthesia. Preop C -arm was used to mark spinal anatomy.  His low back was prepped and draped in usual sterile fashion with betadine scrub and DuraPrep. Area of incision was infiltrated with local lidocaine. Incision was made to the lumbodorsal fascia was incised and exposure was performed of the L4 through L5 spinous processes laminae facet joint and transverse processes. Intraoperative x-ray was obtained which confirmed correct orientation. A total laminectomy of L4 was performed with disarticulation of the facet joints at this level and thorough decompression was performed of both L4 and L5 nerve roots along with the common dural tube. This  decompression was more involved than would be typical of that performed for PLIF alone and included painstaking dissection of adherent ligament compressing the thecal sac and wide decompression of all neural elements. A thorough discectomy was initially performed on the left with preparation of the endplates for grafting a trial spacer was placed this level and a thorough discectomy was performed on the right as well. Bone autograft was packed within the interspace bilaterally. Bilateral median 10 x 23 x 12 degree  peek cages were packed with bone autograft and was inserted the interspace and countersunk appropriately along with 10 cc of morselized bone autograft. The posterolateral region was extensively decorticated and pedicle probes were placed at L4 and L5 bilaterally. Intraoperative fluoroscopy confirmed correct orientationin the AP and lateral plane. 45 x 6.5 mm pedicle screws were placed at L5 bilaterally and 50 x 6.5 mm screws placed at L4 bilaterally final x-rays demonstrated well-positioned interbody grafts and pedicle screw fixation. A 40 mm lordotic rod was placed on the right and a 40 mm rod was placed on the left locked down in situ and the posterolateral region was packed with the remaining autograft and allograft (20 cc bilaterally). The wound was irrigated. Long-acting Marcaine was injected in the subcutaneous tissues. Fascia was closed with 1 Vicryl sutures skin edges were reapproximated 2 and 3-0 Vicryl sutures. The wound is dressed with Dermabond and the patient was extubated in the operating room and taken to recovery in stable satisfactory condition. He tolerated the operation well counts were correct at the end of the case.   PLAN OF CARE: Admit to inpatient   PATIENT DISPOSITION:  PACU -   hemodynamically stable.   Delay start of Pharmacological VTE agent (>24hrs) due to surgical blood loss or risk of bleeding: yes

## 2018-10-19 NOTE — Progress Notes (Signed)
Awake, alert, MAEW with good strength.  Doing well.

## 2018-10-20 NOTE — Progress Notes (Signed)
Pt already placed on CPAP by RN. RT will continue to monitor.

## 2018-10-20 NOTE — Evaluation (Signed)
Occupational Therapy Evaluation Patient Details Name: Spencer Clark MRN: [id number] DOB: 19-Jan-1941 Today's Date: 10/20/2018    History of Present Illness Pt is a 78 y/o male now s/p PLIF L4-5. PMHx includes HTN, hernia repair, arthritis    Clinical Impression   This 78 y/o male presents with the above. PTA pt reports independence with ADL and functional mobility. Pt currently performing functional mobility using RW at [facility] assist level. He currently requires minA for brace management, modA for LB ADL secondary to adhering to back precautions. Pt reports plans to stay with friends initially in their basement apartment with assist from spouse and daughter. Pt does express concerns with mobility into basement as he reports has to go down two flights of stairs to get there. Further educated pt re: back precautions, brace management, safety and compensatory techniques for performing ADL and functional transfers while maintaining precautions. Pt very HOH and requires frequent redirection/repetition, so will benefit from further review/continued acute OT services while he remains in acute setting to further maximize his safety and independence with ADL and mobility. Will follow.     Follow Up Recommendations  No OT follow up;Supervision/Assistance - 24 hour    Equipment Recommendations  3 in 1 bedside commode           Precautions / Restrictions Precautions Precautions: Back;Fall Precaution Booklet Issued: Yes (comment) Precaution Comments: issued and reviewed with pt Required Braces or Orthoses: Spinal Brace Spinal Brace: Lumbar corset;Applied in sitting position Restrictions Weight Bearing Restrictions: No      Mobility Bed Mobility               General bed mobility comments: pt received sitting EOB  Transfers Overall transfer level: Needs assistance Equipment used: Rolling walker (2 wheeled) Transfers: Sit to/from Stand Sit to Stand: Min guard;Min assist          General transfer comment: intermittent light minA for boosting/steadying at RW, VCs safe hand placement    Balance Overall balance assessment: Needs assistance Sitting-balance support: Feet supported Sitting balance-Leahy Scale: Good     Standing balance support: Bilateral upper extremity supported Standing balance-Leahy Scale: Poor Standing balance comment: reliant on UE support at this time                           ADL either performed or assessed with clinical judgement   ADL Overall ADL's : Needs assistance/impaired Eating/Feeding: Modified independent;Sitting   Grooming: Set up;Sitting   Upper Body Bathing: Min guard;Sitting   Lower Body Bathing: Moderate assistance;Sit to/from stand   Upper Body Dressing : Min guard;Minimal assistance;Sitting Upper Body Dressing Details (indicate cue type and reason): reviewed brace management, pt return demonstrating with minA/mod cues Lower Body Dressing: Moderate assistance;Sit to/from stand Lower Body Dressing Details (indicate cue type and reason): minguard A standing balance, increased assist due to maintaining back precautions Toilet Transfer: Min guard;Ambulation;RW Toilet Transfer Details (indicate cue type and reason): simulated via transfer to/from EOB Toileting- Clothing Manipulation and Hygiene: Minimal assistance;Sit to/from Nurse, children's Details (indicate cue type and reason): educated in uses of 3:1 and educated to use as shower chair with bathing initially once cleared to shower Functional mobility during ADLs: Surveyor, minerals     Praxis      Pertinent Vitals/Pain Pain Assessment: Faces Faces Pain Scale: Hurts even more Pain Location: incisional,  with transitions Pain Descriptors / Indicators: Discomfort;Grimacing;Guarding Pain Intervention(s): Limited activity within patient's tolerance;Monitored during session;Repositioned     Hand  Dominance     Extremity/Trunk Assessment Upper Extremity Assessment Upper Extremity Assessment: Generalized weakness   Lower Extremity Assessment Lower Extremity Assessment: Defer to PT evaluation   Cervical / Trunk Assessment Cervical / Trunk Assessment: Other exceptions Cervical / Trunk Exceptions: s/p spinal sx   Communication Communication Communication: HOH   Cognition Arousal/Alertness: Awake/alert Behavior During Therapy: WFL for tasks assessed/performed Overall Cognitive Status: Difficult to assess                                 General Comments: pt very HOH and easily distracted, requires cues to redirect to task at hand, when providing instruction or statement pt often responding with statement unrelated to topic, suspect this is partly due to pt being very HOH and doesn't have his hearing aids with him. able to recall therapist's name end of session   General Comments       Exercises     Shoulder Instructions      Home Living Family/patient expects to be discharged to:: Private residence Living Arrangements: Spouse/significant other;Children Available Help at Discharge: Family Type of Home: House       Home Layout: Multi-level Alternate Level Stairs-Number of Steps: 2 flights to basement where pt will be staying Alternate Level Stairs-Rails: Right Bathroom Shower/Tub: Producer, television/film/videoWalk-in shower   Bathroom Toilet: Standard         Additional Comments: pt [place], plans to stay here with friends initially (basement apartment), spouse to stay and daughter lives in [place] who also plans to assist      Prior Functioning/Environment Level of Independence: Independent                 OT Problem List: Decreased strength;Decreased range of motion;Decreased activity tolerance;Impaired balance (sitting and/or standing);Decreased knowledge of use of DME or AE;Decreased knowledge of precautions;Pain      OT  Treatment/Interventions: Self-care/ADL training;Therapeutic exercise;DME and/or AE instruction;Therapeutic activities;Patient/family education;Balance training    OT Goals(Current goals can be found in the care plan section) Acute Rehab OT Goals Patient Stated Goal: wants to stay one more day before home OT Goal Formulation: With patient Time For Goal Achievement: 11/03/18 Potential to Achieve Goals: Good  OT Frequency: Min 2X/week   Barriers to D/C:            Co-evaluation              AM-PAC OT "6 Clicks" Daily Activity     Outcome Measure Help from another person eating meals?: None Help from another person taking care of personal grooming?: A Little Help from another person toileting, which includes using toliet, bedpan, or urinal?: A Little Help from another person bathing (including washing, rinsing, drying)?: A Lot Help from another person to put on and taking off regular upper body clothing?: A Little Help from another person to put on and taking off regular lower body clothing?: A Lot 6 Click Score: 17   End of Session Equipment Utilized During Treatment: Rolling walker;Back brace Nurse Communication: Mobility status  Activity Tolerance: Patient tolerated treatment well Patient left: with call bell/phone within reach;Other (comment)(seated EOB, PT present to begin session)  OT Visit Diagnosis: Other abnormalities of gait and mobility (R26.89);Muscle weakness (generalized) (M62.81);Pain Pain - part of body: (back)  Time: 7711-6579 OT Time Calculation (min): 29 min Charges:  OT General Charges $OT Visit: 1 Visit OT Evaluation $OT Eval Low Complexity: 1 Low OT Treatments $Self Care/Home Management : 8-22 mins  Marcy Siren, OT Supplemental Rehabilitation Services Pager [phone] Office [phone]  Orlando Penner 10/20/2018, 10:28 AM

## 2018-10-20 NOTE — Progress Notes (Addendum)
Subjective: Patient reports "I have some pain in my back, but none in my legs"  Objective: Vital signs in last 24 hours: Temp:  [97.4 F (36.3 C)-98.3 F (36.8 C)] 98.1 F (36.7 C) (09/11 1205) Pulse Rate:  [54-93] 73 (09/11 1205) Resp:  [13-20] 16 (09/11 1205) BP: (99-197)/(68-98) 130/73 (09/11 1205) SpO2:  [93 %-96 %] 95 % (09/11 1205)  Intake/Output from previous day: 09/10 0701 - 09/11 0700 In: 1520 [P.O.:320; I.V.:1000] Out: 1075 [Urine:575; Blood:500] Intake/Output this shift: Total I/O In: 360 [P.O.:360] Out: -   Awakens to voice. Reports incisional pain, no leg pain. Good strength BLE. Incision without erythema, swelling, or drainage beneath honeycomb and Dermabond.   Lab Results: Recent Labs    10/19/18 0705  WBC 5.8  HGB 16.7  HCT 51.6  PLT 120*   BMET Recent Labs    10/19/18 0705  NA 140  K 3.9  CL 108  CO2 20*  GLUCOSE 122*  BUN 27*  CREATININE 1.16  CALCIUM 8.9    Studies/Results: Dg Lumbar Spine 2-3 Views  Result Date: 10/19/2018 CLINICAL DATA:  L4-5 fusion EXAM: LUMBAR SPINE - 2-3 VIEW; DG C-ARM 1-60 MIN COMPARISON:  None. FLUOROSCOPY TIME:  Radiation Exposure Index (as provided by the fluoroscopic device): Not available If the device does not provide the exposure index: Fluoroscopy Time:  27 seconds Number of Acquired Images:  5 FINDINGS: Initial images demonstrate surgical instruments posterior to the L4 and L5 levels. Subsequent interbody fusion is noted at L4-5 with pedicle screw placement IMPRESSION: L4-5 fusion Electronically Signed   By: Inez Catalina M.D.   On: 10/19/2018 12:20   Dg C-arm 1-60 Min  Result Date: 10/19/2018 CLINICAL DATA:  L4-5 fusion EXAM: LUMBAR SPINE - 2-3 VIEW; DG C-ARM 1-60 MIN COMPARISON:  None. FLUOROSCOPY TIME:  Radiation Exposure Index (as provided by the fluoroscopic device): Not available If the device does not provide the exposure index: Fluoroscopy Time:  27 seconds Number of Acquired Images:  5 FINDINGS: Initial  images demonstrate surgical instruments posterior to the L4 and L5 levels. Subsequent interbody fusion is noted at L4-5 with pedicle screw placement IMPRESSION: L4-5 fusion Electronically Signed   By: Inez Catalina M.D.   On: 10/19/2018 12:20    Assessment/Plan: Improving  LOS: 1 day  Continue to mobilize. Expect d/c to home in am with rolling walker and 3-in-1 elevated commode seat. Office f/u in 3-4 weeks.    Verdis Prime 10/20/2018, 1:12 PM  Patient is doing well.  Anticipate discharge in AM.

## 2018-10-20 NOTE — Evaluation (Signed)
Physical Therapy Evaluation Patient Details Name: Spencer MaudlinCharles Clark MRN: [id number] DOB: 10/07/1940 Today's Date: 10/20/2018   History of Present Illness  Pt is a 78 y/o male now s/p PLIF L4-5. PMHx includes HTN, hernia repair, arthritis   Clinical Impression  Patient presents with pain and post surgical deficits [place]p [place] staying with friends (2 doctors) at d/c. Pt independent PTA but does report hx of knee arthritis and discomfort. Today, pt requires Min guard assist for transfers and gait training with use of RW for support. Cues needed for upright posture. Tolerated stair training with Min A for support due to bil knee weakness. Pt has to negotiate 2 flight of stairs to get to basement apt where he will be staying. Encouraged having daughter assist with this for safety. Education re: back precautions, brace, log roll technique, positioning, use of RW etc. Would benefit from practicing more stairs tomorrow as able. Will follow acutely to maximize independence and mobility prior to return home.    Follow Up Recommendations No PT follow up;Supervision for mobility/OOB    Equipment Recommendations  Rolling walker with 5" wheels(if does not have one)    Recommendations for Other Services       Precautions / Restrictions Precautions Precautions: Back;Fall Precaution Booklet Issued: Yes (comment) Precaution Comments: issued and reviewed with pt Required Braces or Orthoses: Spinal Brace Spinal Brace: Lumbar corset;Applied in sitting position Restrictions Weight Bearing Restrictions: No      Mobility  Bed Mobility Overal bed mobility: Needs Assistance Bed Mobility: Sit to Sidelying         Sit to sidelying: HOB elevated;Supervision General bed mobility comments: pt received sitting EOB. Able to bring LEs into bed to return to supine, good demo of log roll technique, Able to recall this from earlier.  Transfers Overall transfer  level: Needs assistance Equipment used: Rolling walker (2 wheeled) Transfers: Sit to/from Stand Sit to Stand: Min guard         General transfer comment: Min guard for safety. Stood from EOB with cues for hand placement/technique.  Ambulation/Gait Ambulation/Gait assistance: Min guard Gait Distance (Feet): 400 Feet Assistive device: Rolling walker (2 wheeled) Gait Pattern/deviations: Step-through pattern;Decreased stride length;Trunk flexed Gait velocity: decreased   General Gait Details: Slow, steady gait with RW for support.  A few standing rest breaks.  Stairs Stairs: Yes Stairs assistance: Min assist Stair Management: One rail Right;Step to pattern Number of Stairs: 4 General stair comments: Cues for technique/safety. Min A to ascend and balance when descending steps. Worsened back pain. Reports hx of knee arthritis bilaterally.  Wheelchair Mobility    Modified Rankin (Stroke Patients Only)       Balance Overall balance assessment: Needs assistance Sitting-balance support: Feet supported;No upper extremity supported Sitting balance-Leahy Scale: Good     Standing balance support: During functional activity Standing balance-Leahy Scale: Poor Standing balance comment: reliant on UE support at this time                             Pertinent Vitals/Pain Pain Assessment: Faces Faces Pain Scale: Hurts even more Pain Location: incisional, with transitions Pain Descriptors / Indicators: Discomfort;Grimacing;Guarding;Sore;Operative site guarding Pain Intervention(s): Repositioned;Monitored during session;Limited activity within patient's tolerance    Home Living Family/patient expects to be discharged to:: Private residence Living Arrangements: Spouse/significant other;Children Available Help at Discharge: Family Type of Home: House Home Access: Stairs to enter  Entrance Stairs-Rails: Right;Left Entrance Stairs-Number of Steps: 2 flights? Home Layout:  Multi-level Home Equipment: Walker - 2 wheels Additional Comments: pt reports he lives on [address], plans to stay here with friends initially (basement apartment), spouse to stay and daughter lives in [place] who also plans to assist    Prior Function Level of Independence: Independent               Hand Dominance        Extremity/Trunk Assessment   Upper Extremity Assessment Upper Extremity Assessment: Defer to OT evaluation    Lower Extremity Assessment Lower Extremity Assessment: Generalized weakness    Cervical / Trunk Assessment Cervical / Trunk Assessment: Other exceptions Cervical / Trunk Exceptions: s/p spinal sx  Communication   Communication: HOH  Cognition Arousal/Alertness: Awake/alert Behavior During Therapy: WFL for tasks assessed/performed Overall Cognitive Status: Difficult to assess                                 General Comments: pt very HOH and easily distracted, requires cues to redirect to task at hand, when providing instruction or statement pt often responding with statement unrelated to topic, suspect this is partly due to pt being very HOH and doesn't have his hearing aids with him. able to recall OT's name from earlier session.      General Comments General comments (skin integrity, edema, etc.): Incision- clean dry and intact.    Exercises     Assessment/Plan    PT Assessment Patient needs continued PT services  PT Problem List Decreased strength;Decreased mobility;Decreased knowledge of precautions;Decreased balance;Pain;Decreased skin integrity;Decreased cognition       PT Treatment Interventions Therapeutic activities;Gait training;Therapeutic exercise;Patient/family education;Stair training;Balance training;Neuromuscular re-education    PT Goals (Current goals can be found in the Care Plan section)  Acute Rehab PT Goals Patient Stated Goal: wants to stay one more day before home PT Goal Formulation: With  patient Time For Goal Achievement: 11/03/18 Potential to Achieve Goals: Good    Frequency Min 5X/week   Barriers to discharge Inaccessible home environment stairs    Co-evaluation               AM-PAC PT "6 Clicks" Mobility  Outcome Measure Help needed turning from your back to your side while in a flat bed without using bedrails?: A Little Help needed moving from lying on your back to sitting on the side of a flat bed without using bedrails?: A Little Help needed moving to and from a bed to a chair (including a wheelchair)?: A Little Help needed standing up from a chair using your arms (e.g., wheelchair or bedside chair)?: A Little Help needed to walk in hospital room?: A Little Help needed climbing 3-5 steps with a railing? : A Little 6 Click Score: 18    End of Session Equipment Utilized During Treatment: Gait belt;Back brace Activity Tolerance: Patient tolerated treatment well Patient left: in bed;with call bell/phone within reach Nurse Communication: Mobility status PT Visit Diagnosis: Difficulty in walking, not elsewhere classified (R26.2);Pain Pain - part of body: (incision/back)    Time: 7564-3329 PT Time Calculation (min) (ACUTE ONLY): 28 min   Charges:   PT Evaluation $PT Eval Moderate Complexity: 1 Mod PT Treatments $Gait Training: 8-22 mins        Wray Kearns, PT, DPT Acute Rehabilitation Services Pager [phone] Office [place] 10/20/2018, 11:10 AM

## 2018-10-21 MED ORDER — DEXAMETHASONE SODIUM PHOSPHATE 4 MG/ML IJ SOLN
4.0000 mg | Freq: Four times a day (QID) | INTRAMUSCULAR | Status: DC
  Administered 2018-10-21: 4 mg via INTRAVENOUS
  Filled 2018-10-21: qty 1

## 2018-10-21 MED ORDER — METHOCARBAMOL 500 MG PO TABS: 500.0000 mg | ORAL_TABLET | Freq: Four times a day (QID) | ORAL | 1 refills | Status: AC | PRN

## 2018-10-21 MED ORDER — PANTOPRAZOLE SODIUM 40 MG PO TBEC: 40.0000 mg | DELAYED_RELEASE_TABLET | Freq: Two times a day (BID) | ORAL | Status: DC

## 2018-10-21 MED ORDER — OXYCODONE HCL 5 MG PO TABS: 5.0000 mg | ORAL_TABLET | ORAL | 0 refills | Status: DC | PRN

## 2018-10-21 NOTE — Discharge Summary (Signed)
Physician Discharge Summary  Patient ID: Spencer Clark MRN: [id number] DOB/AGE: [birth year] 78 y.o.  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   He did well and gradually mobilized with improved leg pain and numbness.  Consults: None  Significant Diagnostic Studies: None  Treatments: surgery: Patient underwent decompression with fusion L 45 level  Discharge Exam: Blood pressure 140/78, pulse 80, temperature 99.5 F (37.5 C), temperature source Oral, resp. rate 18, height 5\' 6"  (1.676 m), weight 100.7 kg, SpO2 93 %. Neurologic: Alert and oriented X 3, normal strength and tone. Normal symmetric reflexes. Normal coordination and gait Wound: CDI  Disposition: Home  Discharge Instructions    Diet - low sodium heart healthy   Complete by: As directed    Increase activity slowly   Complete by: As directed      Allergies as of 10/21/2018   No Known Allergies     Medication List    TAKE these medications   acetaminophen 500 MG tablet Commonly known as: TYLENOL Take 1,000 mg by mouth every 6 (six) hours as needed for moderate pain or headache.   aspirin 81 MG chewable tablet Chew 81 mg by mouth daily.   celecoxib 200 MG capsule Commonly known as: CELEBREX Take 200 mg by mouth 2 (two) times daily.   methocarbamol 500 MG tablet Commonly known as: ROBAXIN Take 1 tablet (500 mg total) by mouth every 6 (six) hours as needed for muscle spasms.   metoprolol tartrate 100 MG tablet Commonly known as: LOPRESSOR Take 100 mg by mouth 2 (two) times daily.   multivitamin with minerals Tabs tablet Take 1 tablet by mouth daily.   oxyCODONE 5 MG immediate release tablet Commonly known as: Oxy  IR/ROXICODONE Take 1-2 tablets (5-10 mg total) by mouth every 3 (three) hours as needed for severe pain ((score 4 to 6)).   PreserVision AREDS 2 Caps Take 1 capsule by mouth daily.   Testosterone Cypionate 200 MG/ML Soln Inject 100 mg as directed every 14 (fourteen) days.   Vitamin D (Ergocalciferol) 1.25 MG (50000 UT) Caps capsule Commonly known as: DRISDOL Take 50,000 Units by mouth every Monday.            Durable Medical Equipment  (From admission, onward)         Start     Ordered   10/20/18 1312  For home use only DME 3 n 1  Once     10/20/18 1311   10/20/18 1311  For home use only DME Walker rolling  Once    Question:  Patient needs a walker to treat with the following condition  Answer:  Gait abnormality   10/20/18 1311           Signed: [name], MD 10/21/2018, 11:08 AM

## 2018-10-21 NOTE — Discharge Instructions (Signed)

## 2018-10-21 NOTE — Progress Notes (Signed)
Subjective: Patient reports Overall patient doing fairly well however increased back pain this morning denies any radicular symptoms  Objective: Vital signs in last 24 hours: Temp:  [97.9 F (36.6 C)-98.3 F (36.8 C)] 98.1 F (36.7 C) (09/12 0400) Pulse Rate:  [67-86] 84 (09/12 0400) Resp:  [16-18] 18 (09/12 0400) BP: (99-155)/(68-84) 133/78 (09/12 0400) SpO2:  [92 %-97 %] 92 % (09/11 2335)  Intake/Output from previous day: 09/11 0701 - 09/12 0700 In: 876.4 [P.O.:840; I.V.:36.4] Out: 120 [Blood:120] Intake/Output this shift: Total I/O In: 276.4 [P.O.:240; I.V.:36.4] Out: 120 [Blood:120]  Awake alert strength 5 out of 5 incision clean dry and intact  Lab Results: Recent Labs    10/19/18 0705  WBC 5.8  HGB 16.7  HCT 51.6  PLT 120*   BMET Recent Labs    10/19/18 0705  NA 140  K 3.9  CL 108  CO2 20*  GLUCOSE 122*  BUN 27*  CREATININE 1.16  CALCIUM 8.9    Studies/Results: Dg Lumbar Spine 2-3 Views  Result Date: 10/19/2018 CLINICAL DATA:  L4-5 fusion EXAM: LUMBAR SPINE - 2-3 VIEW; DG C-ARM 1-60 MIN COMPARISON:  None. FLUOROSCOPY TIME:  Radiation Exposure Index (as provided by the fluoroscopic device): Not available If the device does not provide the exposure index: Fluoroscopy Time:  27 seconds Number of Acquired Images:  5 FINDINGS: Initial images demonstrate surgical instruments posterior to the L4 and L5 levels. Subsequent interbody fusion is noted at L4-5 with pedicle screw placement IMPRESSION: L4-5 fusion Electronically Signed   By: Inez Catalina M.D.   On: 10/19/2018 12:20   Dg C-arm 1-60 Min  Result Date: 10/19/2018 CLINICAL DATA:  L4-5 fusion EXAM: LUMBAR SPINE - 2-3 VIEW; DG C-ARM 1-60 MIN COMPARISON:  None. FLUOROSCOPY TIME:  Radiation Exposure Index (as provided by the fluoroscopic device): Not available If the device does not provide the exposure index: Fluoroscopy Time:  27 seconds Number of Acquired Images:  5 FINDINGS: Initial images demonstrate  surgical instruments posterior to the L4 and L5 levels. Subsequent interbody fusion is noted at L4-5 with pedicle screw placement IMPRESSION: L4-5 fusion Electronically Signed   By: Inez Catalina M.D.   On: 10/19/2018 12:20    Assessment/Plan: Patient is not quite ready to be discharged he is postop day 1 from placed with increased back pain.  Will transfer to 4 N. continue to mobilize with physical occupational therapy work on pain management  LOS: 2 days    Adrian 10/21/2018, 6:53 AM

## 2018-10-21 NOTE — Plan of Care (Signed)
Patient alert and oriented, mae's well, voiding adequate amount of urine, swallowing without difficulty, c/o soreness at time of discharge. Patient discharged home with family. Script and discharged instructions given to patient. Patient and family stated understanding of instructions given. Patient has an appointment with Dr. Stern  

## 2018-10-21 NOTE — Progress Notes (Signed)
Subjective: Patient reports doing better  Objective: Vital signs in last 24 hours: Temp:  [97.9 F (36.6 C)-99.5 F (37.5 C)] 99.5 F (37.5 C) (09/12 0719) Pulse Rate:  [69-86] 80 (09/12 0719) Resp:  [16-18] 18 (09/12 0719) BP: (130-155)/(70-84) 140/78 (09/12 0719) SpO2:  [92 %-97 %] 93 % (09/12 0719)  Intake/Output from previous day: 09/11 0701 - 09/12 0700 In: 876.4 [P.O.:840; I.V.:36.4] Out: 120 [Blood:120] Intake/Output this shift: No intake/output data recorded.  Physical Exam: Full strength.  Mild incisional bruising.   Lab Results: Recent Labs    10/19/18 0705  WBC 5.8  HGB 16.7  HCT 51.6  PLT 120*   BMET Recent Labs    10/19/18 0705  NA 140  K 3.9  CL 108  CO2 20*  GLUCOSE 122*  BUN 27*  CREATININE 1.16  CALCIUM 8.9    Studies/Results: No results found.  Assessment/Plan: Doing well.  Discharge home.    LOS: 2 days    [name], MD 10/21/2018, 11:07 AM

## 2018-10-21 NOTE — Progress Notes (Signed)
Physical Therapy Treatment Patient Details Name: Spencer MaudlinCharles Clark MRN: [id number] DOB: 03/15/1940 Today's Date: 10/21/2018    History of Present Illness Pt is a 78 y/o male now s/p PLIF L4-5. PMHx includes HTN, hernia repair, arthritis     PT Comments    Pt progressing towards goals, however, was limited secondary to lethargy. Pt requiring increased time to perform mobility tasks this session and required cues to stay on task. Required min to mod A for mobility tasks, but likely secondary to increased lethargy. Anticipate pt will progress well once lethargy improves. Will continue to follow acutely to maximize functional mobility independence and safety.     Follow Up Recommendations  No PT follow up;Supervision for mobility/OOB(pending progress)     Equipment Recommendations  Rolling walker with 5" wheels    Recommendations for Other Services       Precautions / Restrictions Precautions Precautions: Back;Fall Precaution Booklet Issued: Yes (comment) Precaution Comments: Pt unable to remember back precautions. Reviewed 3/3 precautions.  Required Braces or Orthoses: Spinal Brace Spinal Brace: Lumbar corset;Applied in sitting position Restrictions Weight Bearing Restrictions: No    Mobility  Bed Mobility Overal bed mobility: Needs Assistance Bed Mobility: Sit to Sidelying         Sit to sidelying: Min assist General bed mobility comments: Min A for LE assist to return to sidelying. Cues for log roll technique.   Transfers Overall transfer level: Needs assistance Equipment used: Rolling walker (2 wheeled) Transfers: Sit to/from Stand Sit to Stand: Mod assist         General transfer comment: Mod A for lift assist and steadying to stand. Pt very slow to move and requiring increased time to perform transfers. Cues for hand placement.   Ambulation/Gait Ambulation/Gait assistance: Min assist Gait Distance (Feet): 150 Feet Assistive device: Rolling walker (2  wheeled) Gait Pattern/deviations: Step-through pattern;Decreased stride length;Trunk flexed Gait velocity: decreased   General Gait Details: Pt requiring continuous cues for upright posture and proximity to advice. Min A for steadying and RW management throughout gait. Pt easily distracted throughout gait.    Stairs             Wheelchair Mobility    Modified Rankin (Stroke Patients Only)       Balance Overall balance assessment: Needs assistance Sitting-balance support: Feet supported;No upper extremity supported Sitting balance-Leahy Scale: Good     Standing balance support: Bilateral upper extremity supported;During functional activity Standing balance-Leahy Scale: Poor Standing balance comment: Reliant on BUE support and external support.                             Cognition Arousal/Alertness: Lethargic;Suspect due to medications Behavior During [facility]: [facility] tasks assessed/performed Overall Cognitive Status: Difficult to assess                                 General Comments: Pt very HOH, also very sleepy secondary to medication. Easily distracted and requires reattention to task.       Exercises      General Comments        Pertinent Vitals/Pain Pain Assessment: Faces Faces Pain Scale: Hurts even more Pain Location: back Pain Descriptors / Indicators: Discomfort;Grimacing;Guarding;Sore;Operative site guarding Pain Intervention(s): Limited activity within patient's tolerance;Monitored during session;Repositioned    Home Living  Prior Function            PT Goals (current goals can now be found in the care plan section) Acute Rehab PT Goals Patient Stated Goal: to decrease pain  PT Goal Formulation: With patient Time For Goal Achievement: 11/03/18 Potential to Achieve Goals: Good Progress towards PT goals: Progressing toward goals    Frequency    Min 5X/week      PT Plan  Current plan remains appropriate    Co-evaluation              AM-PAC PT "6 Clicks" Mobility   Outcome Measure  Help needed turning from your back to your side while in a flat bed without using bedrails?: A Little Help needed moving from lying on your back to sitting on the side of a flat bed without using bedrails?: A Little Help needed moving to and from a bed to a chair (including a wheelchair)?: A Little Help needed standing up from a chair using your arms (e.g., wheelchair or bedside chair)?: A Lot Help needed to walk in hospital room?: A Little Help needed climbing 3-5 steps with a railing? : A Little 6 Click Score: 17    End of Session Equipment Utilized During Treatment: Gait belt;Back brace Activity Tolerance: Patient limited by lethargy Patient left: in bed;with call bell/phone within reach;with nursing/sitter in room Nurse Communication: Mobility status PT Visit Diagnosis: Difficulty in walking, not elsewhere classified (R26.2);Pain Pain - part of body: (back)     Time: 1962-2297 PT Time Calculation (min) (ACUTE ONLY): 21 min  Charges:  $Gait Training: 8-22 mins                     Leighton Ruff, PT, DPT  Acute Rehabilitation Services  Pager: [phone] Office: [phone]    Rudean Hitt 10/21/2018, 9:46 AM

## 2018-10-21 NOTE — Evaluation (Addendum)
Occupational Therapy Treatment Patient Details Name: Spencer Clark MRN: [id number] DOB: February 21, 1940 Today's Date: 10/21/2018    History of present illness Pt is a 78 y/o male now s/p PLIF L4-5. PMHx includes HTN, hernia repair, arthritis    OT comments  Pt presents seated EOB, more awake/alert this session compared to earlier this AM. Spouse present and supportive during session as well. Spouse having assisted with dressing ADL prior to OT arrival. Pt requiring minA for brace management/donning; requiring minA for functional transfers using RW. Further reviewed back precautions, safety and compensatory techniques for completing ADL with pt/pt's spouse verbalizing understanding. Pt/pt's spouse opting to discharge home today. Questions answered throughout and pt reports feeling comfortable with completing necessary ADL tasks after return home given spouse/family assist. Will continue to follow while he remains in acute setting.    Follow Up Recommendations  No OT follow up;Supervision/Assistance - 24 hour    Equipment Recommendations  3 in 1 bedside commode          Precautions / Restrictions Precautions Precautions: Back;Fall Precaution Booklet Issued: Yes (comment) Precaution Comments: pt able to recall 3/3 back precautions  Required Braces or Orthoses: Spinal Brace Spinal Brace: Lumbar corset;Applied in sitting position Restrictions Weight Bearing Restrictions: No       Mobility Bed Mobility Overal bed mobility: Needs Assistance Bed Mobility: Sit to Sidelying         Sit to sidelying: Min assist General bed mobility comments: pt seated EOB upon arrival  Transfers Overall transfer level: Needs assistance Equipment used: Rolling walker (2 wheeled) Transfers: Sit to/from Stand Sit to Stand: Min assist         General transfer comment: pt requesting to attempt to perform on his own, given VCs for hand placement and increased attempts pt able to do so, light minA  required intermittently for steadying/boosting    Balance Overall balance assessment: Needs assistance Sitting-balance support: Feet supported;No upper extremity supported Sitting balance-Leahy Scale: Good     Standing balance support: Bilateral upper extremity supported;During functional activity Standing balance-Leahy Scale: Poor Standing balance comment: Reliant on BUE support and external support.                            ADL either performed or assessed with clinical judgement   ADL Overall ADL's : Needs assistance/impaired                 Upper Body Dressing : Minimal assistance;Sitting Upper Body Dressing Details (indicate cue type and reason): assist for brace management   Lower Body Dressing Details (indicate cue type and reason): spouse assisting with LB dressing prior to OT arrival             Functional mobility during ADLs: Minimal assistance;Rolling walker       Vision       Perception     Praxis      Cognition Arousal/Alertness: Awake/alert;Lethargic Behavior During Therapy: WFL for tasks assessed/performed Overall Cognitive Status: Difficult to assess                                 General Comments: Pt very HOH. Easily distracted and requires reattention to task. pt a little sleepy but more awake/alert compared to earlier PT session        Exercises     Shoulder Instructions       General Comments spouse present  and supportive during session    Pertinent Vitals/ Pain       Pain Assessment: Faces Faces Pain Scale: Hurts even more Pain Location: back Pain Descriptors / Indicators: Discomfort;Grimacing;Guarding;Sore;Operative site guarding Pain Intervention(s): Limited activity within patient's tolerance;Monitored during session;Repositioned  Home Living                                          Prior Functioning/Environment              Frequency  Min 2X/week         Progress Toward Goals  OT Goals(current goals can now be found in the care plan section)  Progress towards OT goals: Progressing toward goals  Acute Rehab OT Goals Patient Stated Goal: to decrease pain  OT Goal Formulation: With patient Time For Goal Achievement: 11/03/18 Potential to Achieve Goals: Good  Plan Discharge plan remains appropriate    Co-evaluation                 AM-PAC OT "6 Clicks" Daily Activity     Outcome Measure   Help from another person eating meals?: None Help from another person taking care of personal grooming?: A Little Help from another person toileting, which includes using toliet, bedpan, or urinal?: A Little Help from another person bathing (including washing, rinsing, drying)?: A Lot Help from another person to put on and taking off regular upper body clothing?: A Little Help from another person to put on and taking off regular lower body clothing?: A Lot 6 Click Score: 17    End of Session Equipment Utilized During Treatment: Rolling walker;Back brace  OT Visit Diagnosis: Other abnormalities of gait and mobility (R26.89);Muscle weakness (generalized) (M62.81);Pain Pain - part of body: (back)   Activity Tolerance Patient tolerated treatment well   Patient Left with call bell/phone within reach;with family/visitor present(seated EOB)   Nurse Communication Mobility status        Time: [id number] OT Time Calculation (min): 14 min  Charges: OT General Charges $OT Visit: 1 Visit OT Treatments $Self Care/Home Management : 8-22 mins  Marcy SirenBreanna Bennie Chirico, OT Supplemental Rehabilitation Services Pager [phone] Office [phone]    Orlando PennerBreanna L Marranda Arakelian 10/21/2018, 1:09 PM

## 2020-07-01 IMAGING — RF DG LUMBAR SPINE 2-3V
1 series · 5 of 5 positions shown · non-contrast
Comparison: None.

CLINICAL DATA: L4-5 fusion

EXAM:
LUMBAR SPINE - 2-3 VIEW; DG C-ARM 1-60 MIN

[Series 1: run · 5 of 5 slices shown]
[im 1/5]
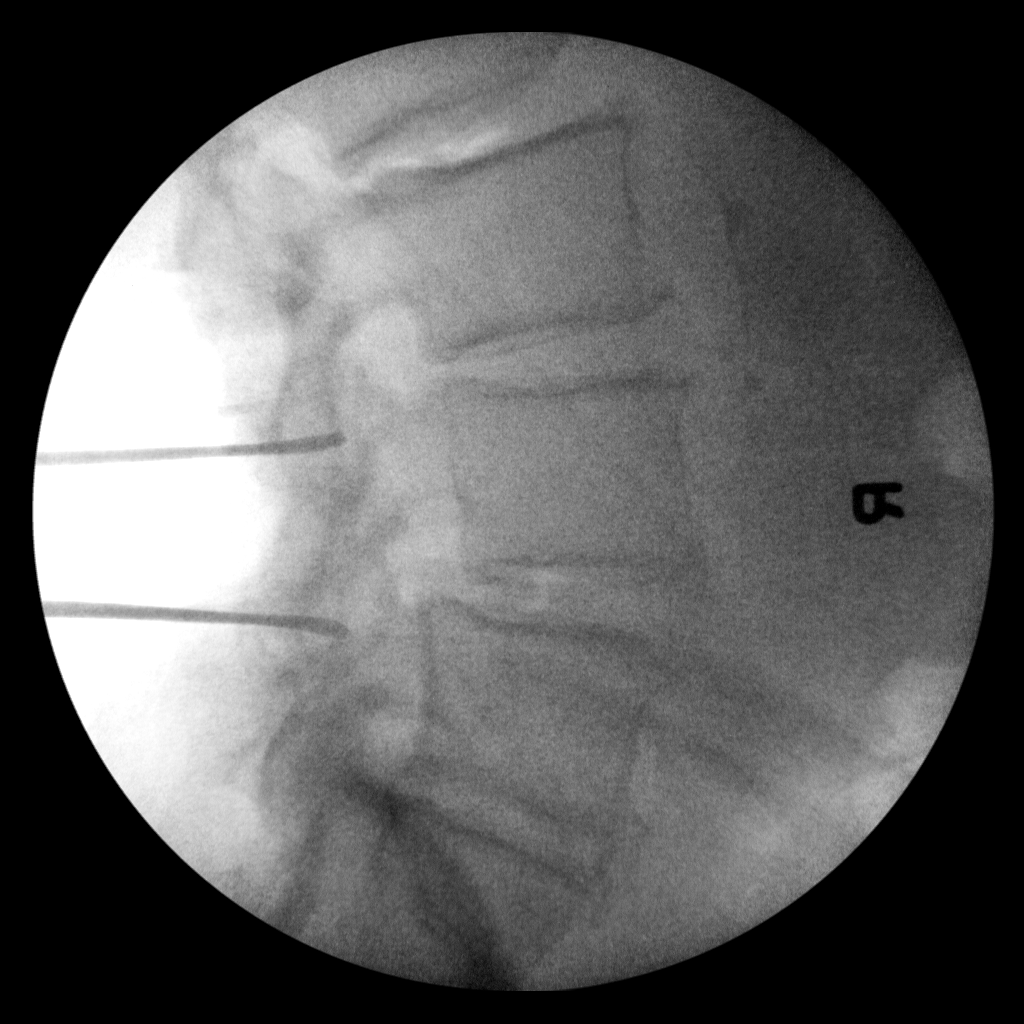
[im 2/5]
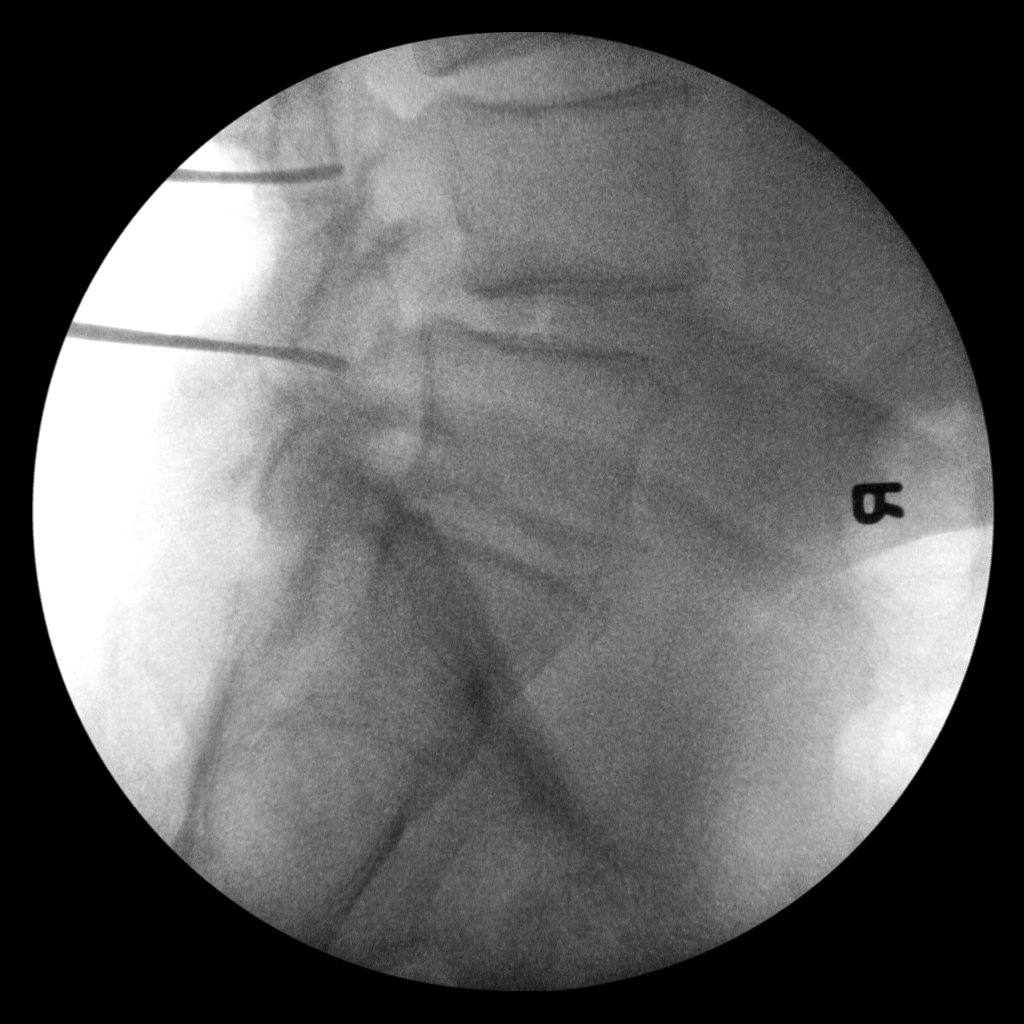
[im 3/5]
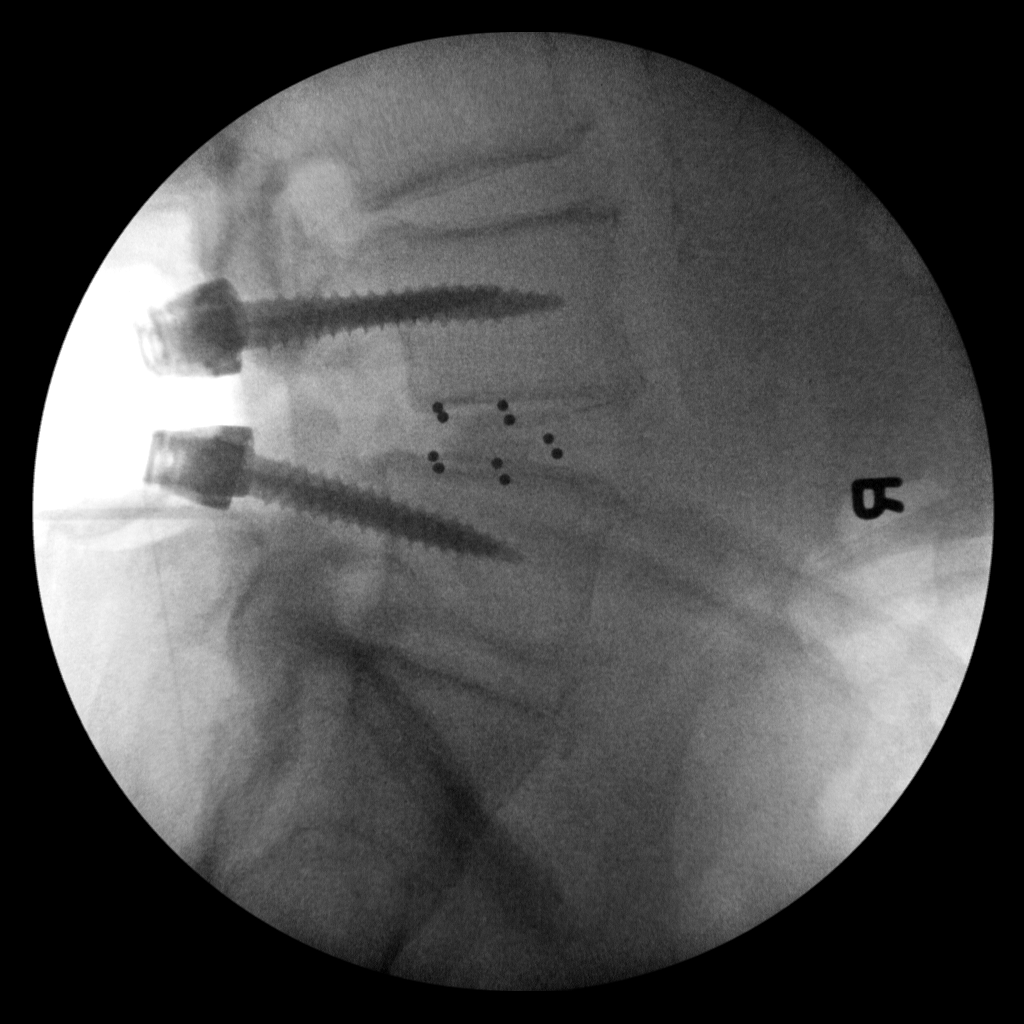
[im 4/5]
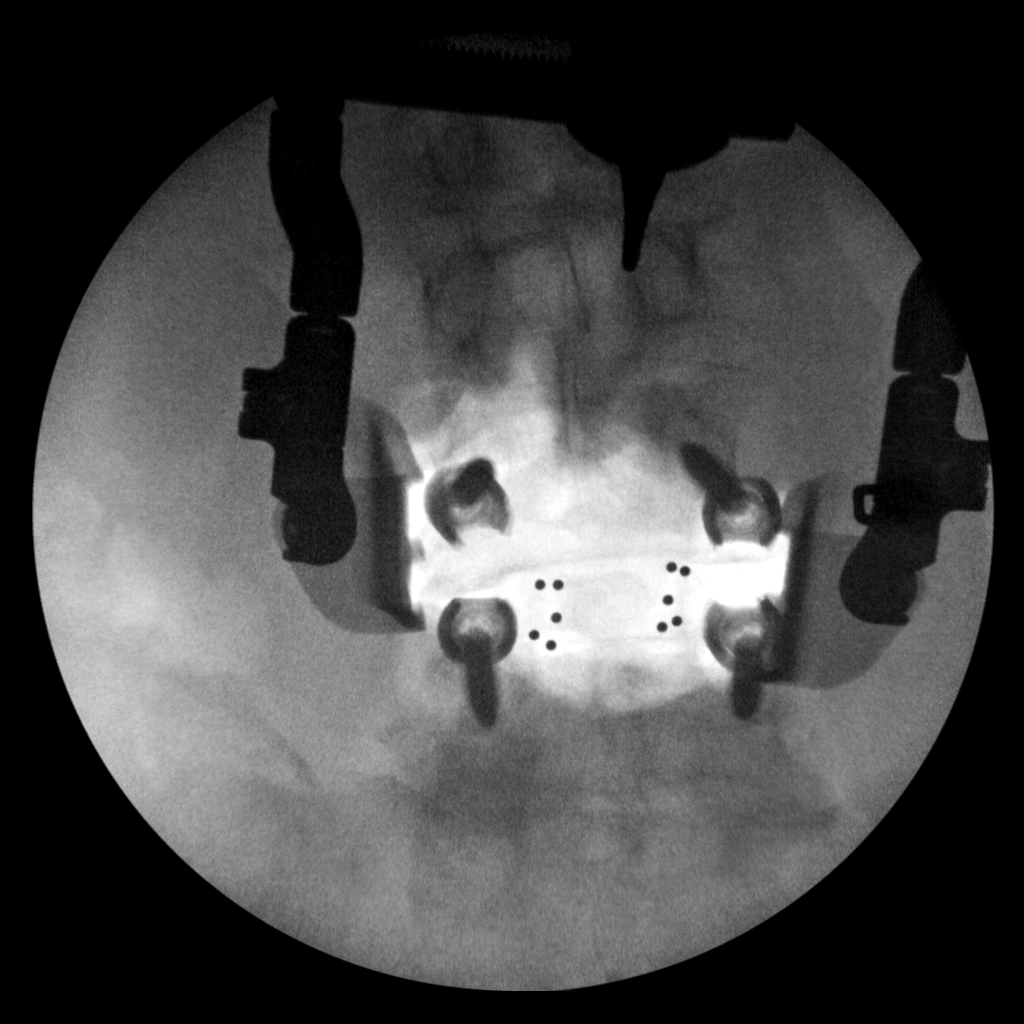
[im 5/5]
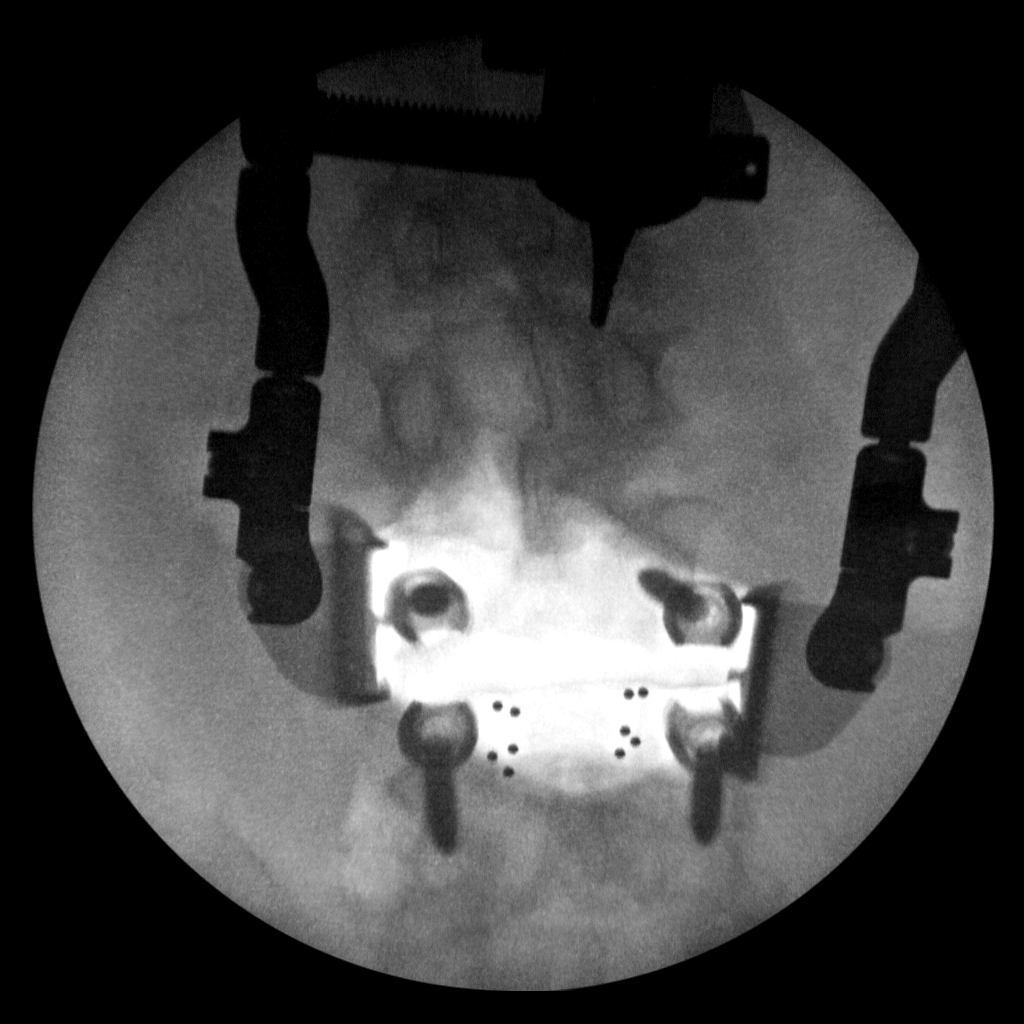

[5 of 5 positions shown; findings below may reference images not displayed]

FLUOROSCOPY TIME:  Radiation Exposure Index (as provided by the
fluoroscopic device): Not available

If the device does not provide the exposure index:

Fluoroscopy Time:  27 seconds

Number of Acquired Images:  5
FINDINGS: Initial images demonstrate surgical instruments posterior to the L4
and L5 levels. Subsequent interbody fusion is noted at L4-5 with
pedicle screw placement
IMPRESSION: L4-5 fusion

## 2020-07-01 IMAGING — RF DG C-ARM 1-60 MIN
1 series · 5 of 5 positions shown · non-contrast
Comparison: None.

CLINICAL DATA: L4-5 fusion

EXAM:
LUMBAR SPINE - 2-3 VIEW; DG C-ARM 1-60 MIN

[Series 1: run · 5 of 5 slices shown]
[im 1/5]
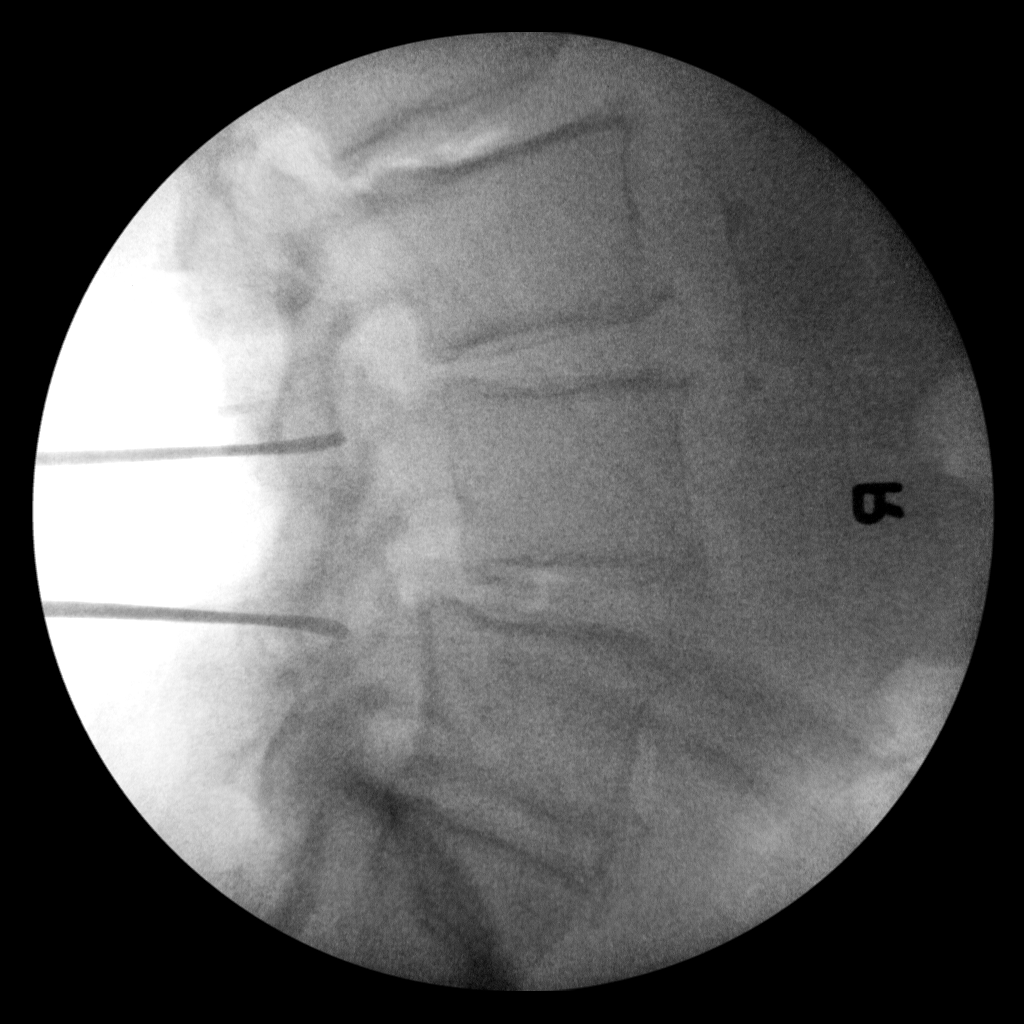
[im 2/5]
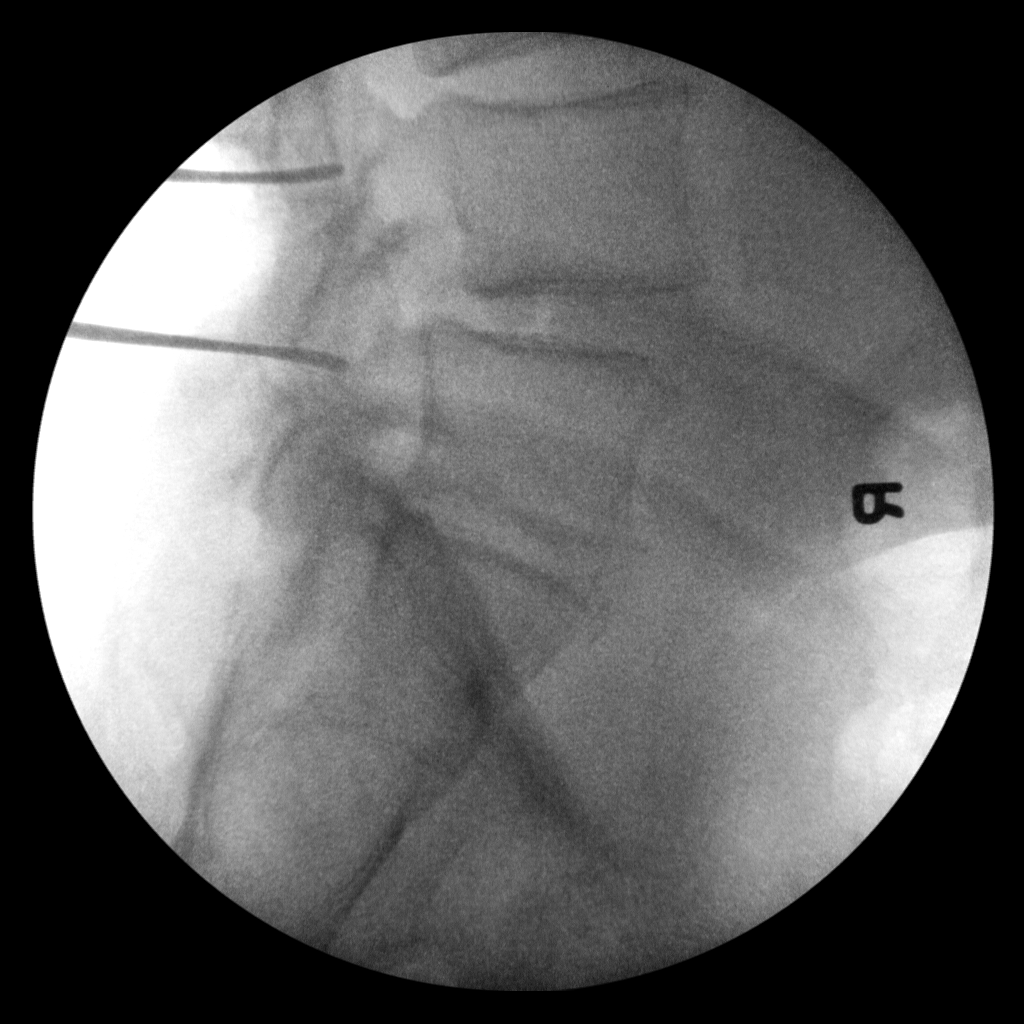
[im 3/5]
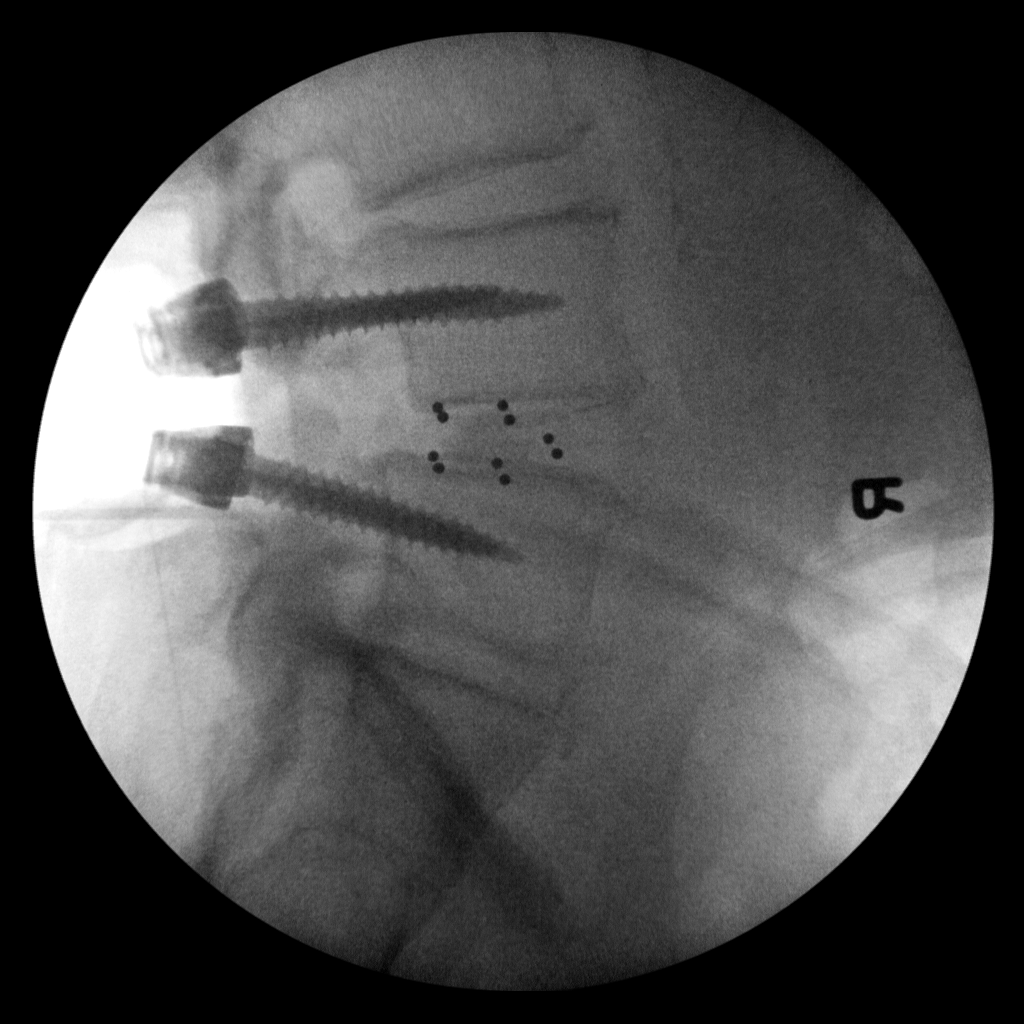
[im 4/5]
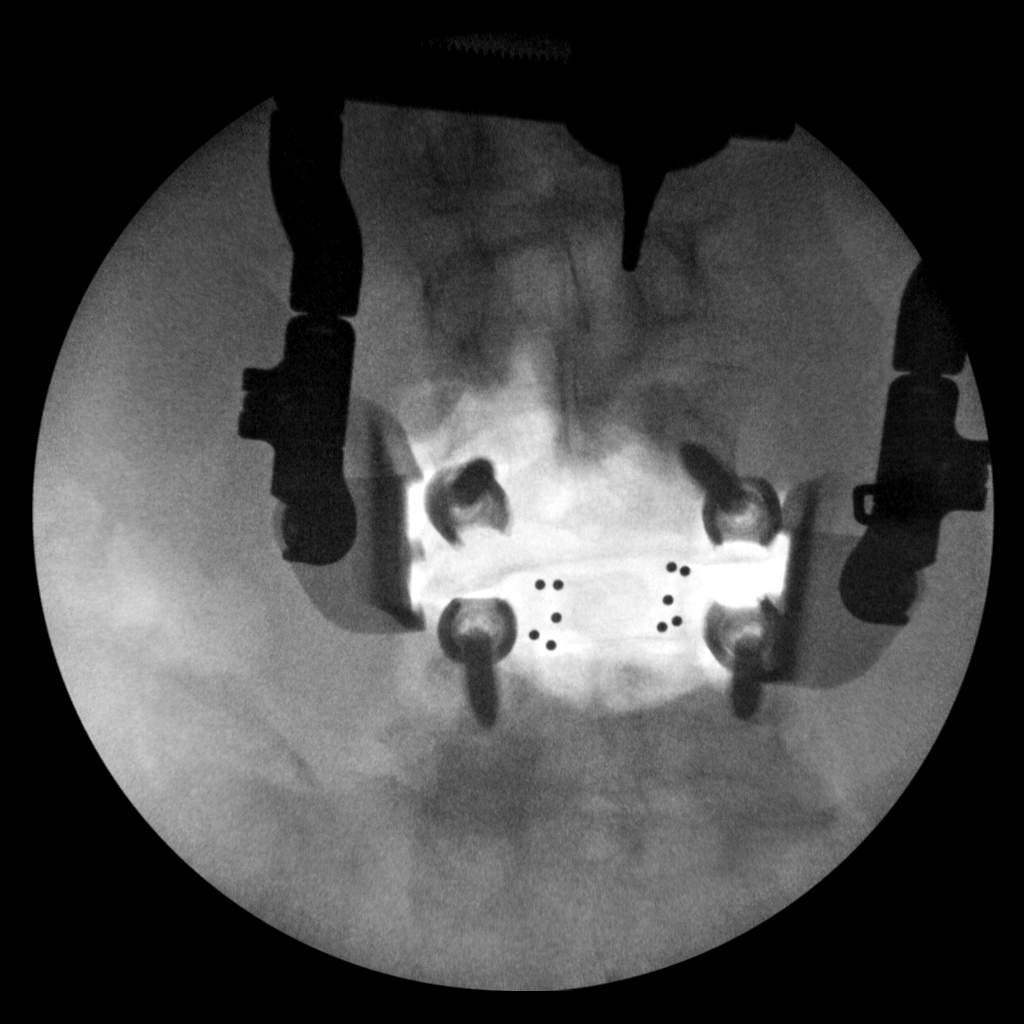
[im 5/5]
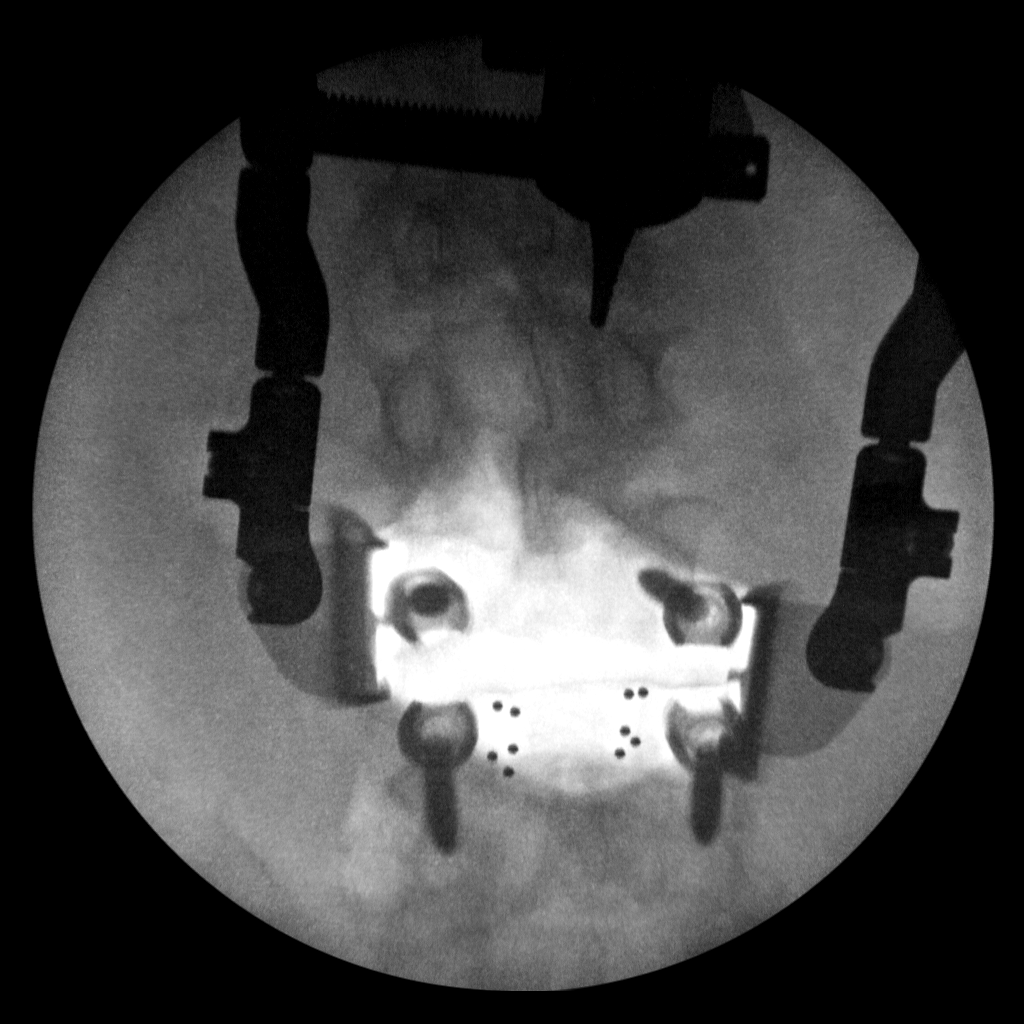

[5 of 5 positions shown; findings below may reference images not displayed]

FLUOROSCOPY TIME:  Radiation Exposure Index (as provided by the
fluoroscopic device): Not available

If the device does not provide the exposure index:

Fluoroscopy Time:  27 seconds

Number of Acquired Images:  5
FINDINGS: Initial images demonstrate surgical instruments posterior to the L4
and L5 levels. Subsequent interbody fusion is noted at L4-5 with
pedicle screw placement
IMPRESSION: L4-5 fusion

## 2022-07-07 ENCOUNTER — Emergency Department (HOSPITAL_COMMUNITY): Payer: Medicare Other

## 2022-07-07 ENCOUNTER — Encounter (HOSPITAL_COMMUNITY): Payer: Self-pay | Admitting: Emergency Medicine

## 2022-07-07 ENCOUNTER — Observation Stay (HOSPITAL_COMMUNITY)
Admission: EM | Admit: 2022-07-07 | Discharge: 2022-07-08 | Disposition: A | Discharge status: Home / Self Care | Payer: Medicare Other | Attending: Internal Medicine | Admitting: Internal Medicine

## 2022-07-07 ENCOUNTER — Other Ambulatory Visit: Payer: Self-pay

## 2022-07-07 DIAGNOSIS — S022XXA Fracture of nasal bones, initial encounter for closed fracture: Secondary | ICD-10-CM | POA: Diagnosis not present

## 2022-07-07 DIAGNOSIS — Z79899 Other long term (current) drug therapy: Secondary | ICD-10-CM | POA: Diagnosis not present

## 2022-07-07 DIAGNOSIS — W010XXA Fall on same level from slipping, tripping and stumbling without subsequent striking against object, initial encounter: Secondary | ICD-10-CM | POA: Insufficient documentation

## 2022-07-07 DIAGNOSIS — Z7982 Long term (current) use of aspirin: Secondary | ICD-10-CM | POA: Insufficient documentation

## 2022-07-07 DIAGNOSIS — M75101 Unspecified rotator cuff tear or rupture of right shoulder, not specified as traumatic: Secondary | ICD-10-CM | POA: Diagnosis not present

## 2022-07-07 DIAGNOSIS — I129 Hypertensive chronic kidney disease with stage 1 through stage 4 chronic kidney disease, or unspecified chronic kidney disease: Secondary | ICD-10-CM | POA: Insufficient documentation

## 2022-07-07 DIAGNOSIS — W19XXXA Unspecified fall, initial encounter: Secondary | ICD-10-CM

## 2022-07-07 DIAGNOSIS — R531 Weakness: Secondary | ICD-10-CM

## 2022-07-07 DIAGNOSIS — N1832 Chronic kidney disease, stage 3b: Secondary | ICD-10-CM | POA: Diagnosis not present

## 2022-07-07 DIAGNOSIS — E875 Hyperkalemia: Secondary | ICD-10-CM | POA: Insufficient documentation

## 2022-07-07 DIAGNOSIS — N183 Chronic kidney disease, stage 3 unspecified: Secondary | ICD-10-CM | POA: Diagnosis present

## 2022-07-07 DIAGNOSIS — N179 Acute kidney failure, unspecified: Secondary | ICD-10-CM

## 2022-07-07 LAB — URINALYSIS, W/ REFLEX TO CULTURE (INFECTION SUSPECTED)
Bacteria, UA: NONE SEEN
Bilirubin Urine: NEGATIVE
Glucose, UA: NEGATIVE mg/dL
Hgb urine dipstick: NEGATIVE
Ketones, ur: NEGATIVE mg/dL
Leukocytes,Ua: NEGATIVE
Nitrite: NEGATIVE
Protein, ur: NEGATIVE mg/dL
Specific Gravity, Urine: 1.015 (ref 1.005–1.030)
pH: 5 (ref 5.0–8.0)

## 2022-07-07 LAB — COMPREHENSIVE METABOLIC PANEL
ALT: 18 U/L (ref 0–44)
AST: 25 U/L (ref 15–41)
Albumin: 4.1 g/dL (ref 3.5–5.0)
Alkaline Phosphatase: 92 U/L (ref 38–126)
Anion gap: 16 — ABNORMAL HIGH (ref 5–15)
BUN: 38 mg/dL — ABNORMAL HIGH (ref 8–23)
CO2: 18 mmol/L — ABNORMAL LOW (ref 22–32)
Calcium: 9 mg/dL (ref 8.9–10.3)
Chloride: 99 mmol/L (ref 98–111)
Creatinine, Ser: 1.57 mg/dL — ABNORMAL HIGH (ref 0.61–1.24)
GFR, Estimated: 44 mL/min — ABNORMAL LOW (ref >60)
Glucose, Bld: 136 mg/dL — ABNORMAL HIGH (ref 70–99)
Potassium: 5.8 mmol/L — ABNORMAL HIGH (ref 3.5–5.1)
Sodium: 133 mmol/L — ABNORMAL LOW (ref 135–145)
Total Bilirubin: 0.8 mg/dL (ref 0.3–1.2)
Total Protein: 7.5 g/dL (ref 6.5–8.1)

## 2022-07-07 LAB — CBC WITH DIFFERENTIAL/PLATELET
Abs Immature Granulocytes: 0.02 10*3/uL (ref 0.00–0.07)
Basophils Absolute: 0 10*3/uL (ref 0.0–0.1)
Basophils Relative: 1 %
Eosinophils Absolute: 0.1 10*3/uL (ref 0.0–0.5)
Eosinophils Relative: 1 %
HCT: 55.6 % — ABNORMAL HIGH (ref 39.0–52.0)
Hemoglobin: 17.5 g/dL — ABNORMAL HIGH (ref 13.0–17.0)
Immature Granulocytes: 0 %
Lymphocytes Relative: 20 %
Lymphs Abs: 1.3 10*3/uL (ref 0.7–4.0)
MCH: 30 pg (ref 26.0–34.0)
MCHC: 31.5 g/dL (ref 30.0–36.0)
MCV: 95.2 fL (ref 80.0–100.0)
Monocytes Absolute: 0.2 10*3/uL (ref 0.1–1.0)
Monocytes Relative: 3 %
Neutro Abs: 4.9 10*3/uL (ref 1.7–7.7)
Neutrophils Relative %: 75 %
Platelets: 137 10*3/uL — ABNORMAL LOW (ref 150–400)
RBC: 5.84 MIL/uL — ABNORMAL HIGH (ref 4.22–5.81)
RDW: 13.7 % (ref 11.5–15.5)
WBC: 6.6 10*3/uL (ref 4.0–10.5)
nRBC: 0 % (ref 0.0–0.2)

## 2022-07-07 LAB — CBG MONITORING, ED: Glucose-Capillary: 139 mg/dL — ABNORMAL HIGH (ref 70–99)

## 2022-07-07 LAB — ETHANOL: Alcohol, Ethyl (B): <10 mg/dL (ref <10)

## 2022-07-07 MED ORDER — ONDANSETRON HCL 4 MG PO TABS: 4.0000 mg | ORAL_TABLET | Freq: Four times a day (QID) | ORAL | Status: DC | PRN

## 2022-07-07 MED ORDER — TRAMADOL HCL 50 MG PO TABS
25.0000 mg | ORAL_TABLET | Freq: Once | ORAL | Status: AC
  Administered 2022-07-07: 25 mg via ORAL
  Filled 2022-07-07: qty 1

## 2022-07-07 MED ORDER — LACTATED RINGERS IV BOLUS
500.0000 mL | Freq: Once | INTRAVENOUS | Status: AC
  Administered 2022-07-07: 500 mL via INTRAVENOUS

## 2022-07-07 MED ORDER — POLYETHYLENE GLYCOL 3350 17 G PO PACK: 17.0000 g | PACK | Freq: Every day | ORAL | Status: DC | PRN

## 2022-07-07 MED ORDER — DOCUSATE SODIUM 100 MG PO CAPS
100.0000 mg | ORAL_CAPSULE | Freq: Two times a day (BID) | ORAL | Status: DC
  Filled 2022-07-07 (×2): qty 1

## 2022-07-07 MED ORDER — TRAMADOL HCL 50 MG PO TABS
50.0000 mg | ORAL_TABLET | Freq: Four times a day (QID) | ORAL | Status: DC | PRN
  Administered 2022-07-07: 50 mg via ORAL
  Filled 2022-07-07: qty 1

## 2022-07-07 MED ORDER — TRAZODONE HCL 50 MG PO TABS: 25.0000 mg | ORAL_TABLET | Freq: Every evening | ORAL | Status: DC | PRN

## 2022-07-07 MED ORDER — ALBUTEROL SULFATE (2.5 MG/3ML) 0.083% IN NEBU: 2.5000 mg | INHALATION_SOLUTION | RESPIRATORY_TRACT | Status: DC | PRN

## 2022-07-07 MED ORDER — SODIUM CHLORIDE 0.9 % IV SOLN: INTRAVENOUS | Status: DC

## 2022-07-07 MED ORDER — ACETAMINOPHEN 650 MG RE SUPP: 650.0000 mg | Freq: Four times a day (QID) | RECTAL | Status: DC | PRN

## 2022-07-07 MED ORDER — ENOXAPARIN SODIUM 40 MG/0.4ML IJ SOSY
40.0000 mg | PREFILLED_SYRINGE | INTRAMUSCULAR | Status: DC
  Administered 2022-07-07: 40 mg via SUBCUTANEOUS
  Filled 2022-07-07: qty 0.4

## 2022-07-07 MED ORDER — ONDANSETRON HCL 4 MG/2ML IJ SOLN: 4.0000 mg | Freq: Four times a day (QID) | INTRAMUSCULAR | Status: DC | PRN

## 2022-07-07 MED ORDER — HYDRALAZINE HCL 20 MG/ML IJ SOLN
5.0000 mg | Freq: Four times a day (QID) | INTRAMUSCULAR | Status: DC | PRN
  Administered 2022-07-07: 5 mg via INTRAVENOUS
  Filled 2022-07-07: qty 1

## 2022-07-07 MED ORDER — ASPIRIN 81 MG PO CHEW
81.0000 mg | CHEWABLE_TABLET | Freq: Every day | ORAL | Status: DC
  Administered 2022-07-08: 81 mg via ORAL
  Filled 2022-07-07: qty 1

## 2022-07-07 MED ORDER — ACETAMINOPHEN 325 MG PO TABS
650.0000 mg | ORAL_TABLET | Freq: Four times a day (QID) | ORAL | Status: DC | PRN
  Administered 2022-07-07: 650 mg via ORAL
  Filled 2022-07-07: qty 2

## 2022-07-07 NOTE — ED Notes (Signed)
Pt ambulated without assistance. He was a little unsteady but basically at his baseline per pt and family.  They are concerned about him having a walker to help him get around.

## 2022-07-07 NOTE — Plan of Care (Signed)
  Problem: Activity: Goal: Risk for activity intolerance will decrease Outcome: Progressing   Problem: Pain Managment: Goal: General experience of comfort will improve Outcome: Progressing   Problem: Safety: Goal: Ability to remain free from injury will improve Outcome: Progressing   

## 2022-07-07 NOTE — ED Provider Notes (Signed)
Care was taken over from Dr. Criss Alvine.  Patient presented with some increased weakness over the last few days although he has some baseline weakness which is chronic.  He also has had some slurred speech as noted by his family although it was felt that this was related to the weakness.  He did not have any other concerns for stroke and the decision was made not to pursue that further.  Given the fall, he had some imaging which did not show any acute abnormalities other than a nasal bone fracture.  His labs do show evidence of an AKI with some hyperkalemia.  He was started on some IV fluids and will plan admission.  I spoke with the hospitalist who will admit the patient for further treatment.   Rolan Bucco, MD 07/07/22 [phone]

## 2022-07-07 NOTE — ED Notes (Signed)
ED TO INPATIENT HANDOFF REPORT  ED Nurse Name and Phone #: Lenell Antu Name/Age/Gender Spencer Clark 82 y.o. male Room/Bed: 024C/024C  Code Status   Code Status: Full Code  Home/SNF/Other Home Patient oriented to: self, place, time, and situation Is this baseline? Yes   Triage Complete: Triage complete  Chief Complaint Hyperkalemia [E87.5]  Triage Note Pt BIB [facility] from a store. PT received a cortisone shot in his right shoulder for recent torn rotator cuff in mechanical fall about 2 months ago. He then went shopping with family and had a mechanical fall today, falling face forward. He has an abrasion to nose and a small lac  to forehead.  Bleeding controlled,no LOC, no known thinners.   Takes ASA losartan and metoprolol per family on scene.  EMS VS122/90 94% RA HR 52 RR 14   Allergies No Known Allergies  Level of Care/Admitting Diagnosis ED Disposition     ED Disposition  Admit   Condition  --   Comment  Hospital Area: [facility] [[id number]]  Level of Care: Med-Surg [16]  May place patient in observation at [facility] or [facility] if equivalent level of care is available:: Yes  Covid Evaluation: Asymptomatic - no recent exposure (last 10 days) testing not required  Diagnosis: Hyperkalemia [454098]  Admitting Physician: Maryln Gottron [[id number]]  Attending Physician: Kirby Crigler, MIR [email or web address] [[id number]]          B Medical/Surgery History Past Medical History:  Diagnosis Date   Arthritis    Hypertension    Sleep apnea    Uses Cpap   Past Surgical History:  Procedure Laterality Date   HERNIA REPAIR     INGUINAL HERNIA REPAIR     LEG SURGERY     Plate in Right leg     A IV Location/Drains/Wounds Patient Lines/Drains/Airways Status     Active Line/Drains/Airways     Name Placement date Placement time Site Days   Peripheral IV 07/07/22 22 G Left;Posterior Hand 07/07/22  1439  Hand  less than 1   Incision (Closed) 10/19/18 Back Other  (Comment) 10/19/18  0904  -- 1357            Intake/Output Last 24 hours  Intake/Output Summary (Last 24 hours) at 07/07/2022 1914 Last data filed at 07/07/2022 1617 Gross per 24 hour  Intake 500 ml  Output --  Net 500 ml    Labs/Imaging Results for orders placed or performed during the hospital encounter of 07/07/22 (from the past 48 hour(s))  Ethanol     Status: None   Collection Time: 07/07/22  1:05 PM  Result Value Ref Range   Alcohol, Ethyl (B) <10 <10 mg/dL    Comment: (NOTE) Lowest detectable limit for serum alcohol is 10 mg/dL.  For medical purposes only. Performed at [facility], [address], [place], [place] [address]   Comprehensive metabolic panel     Status: Abnormal   Collection Time: 07/07/22  2:46 PM  Result Value Ref Range   Sodium 133 (L) 135 - 145 mmol/L   Potassium 5.8 (H) 3.5 - 5.1 mmol/L   Chloride 99 98 - 111 mmol/L   CO2 18 (L) 22 - 32 mmol/L   Glucose, Bld 136 (H) 70 - 99 mg/dL    Comment: Glucose reference range applies only to samples taken after fasting for at least 8 hours.   BUN 38 (H) 8 - 23 mg/dL   Creatinine, Ser 8.46 (H) 0.61 - 1.24  mg/dL   Calcium 9.0 8.9 - 16.1 mg/dL   Total Protein 7.5 6.5 - 8.1 g/dL   Albumin 4.1 3.5 - 5.0 g/dL   AST 25 15 - 41 U/L   ALT 18 0 - 44 U/L   Alkaline Phosphatase 92 38 - 126 U/L   Total Bilirubin 0.8 0.3 - 1.2 mg/dL   GFR, Estimated 44 (L) >60 mL/min    Comment: (NOTE) Calculated using the CKD-EPI Creatinine Equation (2021)    Anion gap 16 (H) 5 - 15    Comment: Performed at [facility], [address] [place], [place] [address]  CBC with Differential     Status: Abnormal   Collection Time: 07/07/22  2:46 PM  Result Value Ref Range   WBC 6.6 4.0 - 10.5 K/uL   RBC 5.84 (H) 4.22 - 5.81 MIL/uL   Hemoglobin 17.5 (H) 13.0 - 17.0 g/dL   HCT 54.0 (H) 98.1 - 19.1 %   MCV 95.2 80.0 - 100.0 fL   MCH 30.0 26.0 - 34.0 pg   MCHC 31.5 30.0 - 36.0 g/dL   RDW 47.8 29.5 - 62.1 %    Platelets 137 (L) 150 - 400 K/uL   nRBC 0.0 0.0 - 0.2 %   Neutrophils Relative % 75 %   Neutro Abs 4.9 1.7 - 7.7 K/uL   Lymphocytes Relative 20 %   Lymphs Abs 1.3 0.7 - 4.0 K/uL   Monocytes Relative 3 %   Monocytes Absolute 0.2 0.1 - 1.0 K/uL   Eosinophils Relative 1 %   Eosinophils Absolute 0.1 0.0 - 0.5 K/uL   Basophils Relative 1 %   Basophils Absolute 0.0 0.0 - 0.1 K/uL   Immature Granulocytes 0 %   Abs Immature Granulocytes 0.02 0.00 - 0.07 K/uL    Comment: Performed at [facility], [address], [place], [place] [address]  CBG monitoring, ED     Status: Abnormal   Collection Time: 07/07/22  3:17 PM  Result Value Ref Range   Glucose-Capillary 139 (H) 70 - 99 mg/dL    Comment: Glucose reference range applies only to samples taken after fasting for at least 8 hours.   Comment 1 Notify RN    Comment 2 Document in Chart   Urinalysis, w/ Reflex to Culture (Infection Suspected) -Urine, Clean Catch     Status: None   Collection Time: 07/07/22  4:30 PM  Result Value Ref Range   Specimen Source URINE, CLEAN CATCH    Color, Urine YELLOW YELLOW   APPearance CLEAR CLEAR   Specific Gravity, Urine 1.015 1.005 - 1.030   pH 5.0 5.0 - 8.0   Glucose, UA NEGATIVE NEGATIVE mg/dL   Hgb urine dipstick NEGATIVE NEGATIVE   Bilirubin Urine NEGATIVE NEGATIVE   Ketones, ur NEGATIVE NEGATIVE mg/dL   Protein, ur NEGATIVE NEGATIVE mg/dL   Nitrite NEGATIVE NEGATIVE   Leukocytes,Ua NEGATIVE NEGATIVE   RBC / HPF 0-5 0 - 5 RBC/hpf   WBC, UA 0-5 0 - 5 WBC/hpf    Comment:        Reflex urine culture not performed if WBC <=10, OR if Squamous epithelial cells >5. If Squamous epithelial cells >5 suggest recollection.    Bacteria, UA NONE SEEN NONE SEEN   Squamous Epithelial / HPF 0-5 0 - 5 /HPF   Mucus PRESENT     Comment: Performed at [facility], [address], [place], [place] [address]   CT Head Wo Contrast  Result  Date: 07/07/2022 CLINICAL DATA:  Fall EXAM: CT HEAD WITHOUT  CONTRAST CT MAXILLOFACIAL WITHOUT CONTRAST CT CERVICAL SPINE WITHOUT CONTRAST TECHNIQUE: Multidetector CT imaging of the head, cervical spine, and maxillofacial structures were performed using the standard protocol without intravenous contrast. Multiplanar CT image reconstructions of the cervical spine and maxillofacial structures were also generated. RADIATION DOSE REDUCTION: This exam was performed according to the departmental dose-optimization program which includes automated exposure control, adjustment of the mA and/or kV according to patient size and/or use of iterative reconstruction technique. COMPARISON:  None Available. FINDINGS: CT HEAD FINDINGS Brain: No evidence of acute infarction, hemorrhage, hydrocephalus, extra-axial collection or mass lesion/mass effect. Vascular: No hyperdense vessel or unexpected calcification. CT FACIAL BONES FINDINGS Skull: Normal. Negative for fracture or focal lesion. Facial bones: Displaced fractures of the nasal bones (series 4, image 10). No other displaced fractures or dislocations. Sinuses/Orbits: No acute finding. Total opacification of the right sphenoid sinus with bony sinus wall thickening, likely reflecting chronic sinusitis (series four, image eighteen). Other: Soft tissue edema of the nose. CT CERVICAL SPINE FINDINGS Alignment: Normal. Skull base and vertebrae: No acute fracture. No suspicious osseous lesions. Soft tissues and spinal canal: No prevertebral fluid or swelling. No visible canal hematoma. 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 Moderate to severe associated disc degenerative change at C4-C6. Upper chest: Negative. Other: None. IMPRESSION: 1. No acute intracranial pathology. 2. Displaced fractures of the nasal bones. No other displaced fractures or dislocations of the facial bones. 3. No fracture or static subluxation of the cervical spine. 4. 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 Moderate to severe associated disc degenerative change at C4-C6. 5. Total opacification of the right sphenoid sinus with bony sinus wall thickening, likely reflecting chronic sinusitis. Electronically Signed   By: Jearld Lesch M.D.   On: 07/07/2022 14:28   CT Cervical Spine Wo Contrast  Result Date: 07/07/2022 CLINICAL DATA:  Fall EXAM: CT HEAD WITHOUT CONTRAST CT MAXILLOFACIAL WITHOUT CONTRAST CT CERVICAL SPINE WITHOUT CONTRAST TECHNIQUE: Multidetector CT imaging of the head, cervical spine, and maxillofacial structures were performed using the standard protocol without intravenous contrast. Multiplanar CT image reconstructions of the cervical spine and maxillofacial structures were also generated. RADIATION DOSE REDUCTION: This exam was performed according to the departmental dose-optimization program which includes automated exposure control, adjustment of the mA and/or kV according to patient size and/or use of iterative reconstruction technique. COMPARISON:  None Available. FINDINGS: CT HEAD FINDINGS Brain: No evidence of acute infarction, hemorrhage, hydrocephalus, extra-axial collection or mass lesion/mass effect. Vascular: No hyperdense vessel or unexpected calcification. CT FACIAL BONES FINDINGS Skull: Normal. Negative for fracture or focal lesion. Facial bones: Displaced fractures of the nasal bones (series 4, image 10). No other displaced fractures or dislocations. Sinuses/Orbits: No acute finding. Total opacification of the right sphenoid sinus with bony sinus wall thickening, likely reflecting chronic sinusitis (series four, image eighteen). Other: Soft tissue edema of the nose. CT CERVICAL SPINE FINDINGS Alignment: Normal. Skull base and vertebrae: No acute fracture. No suspicious osseous lesions. Soft tissues and spinal canal: No prevertebral fluid or swelling. No visible canal hematoma. 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 Moderate to severe associated disc degenerative change at C4-C6. Upper chest: Negative. Other: None. IMPRESSION: 1. No acute intracranial pathology. 2. Displaced fractures of the nasal bones. No other displaced fractures or dislocations of the facial bones. 3. No fracture or static subluxation  of the cervical spine. 4. 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 Moderate to severe associated disc degenerative change at C4-C6. 5. Total opacification of the right sphenoid sinus with bony sinus wall thickening, likely reflecting chronic sinusitis. Electronically Signed   By: Jearld Lesch M.D.   On: 07/07/2022 14:28   CT Maxillofacial Wo Contrast  Result Date: 07/07/2022 CLINICAL DATA:  Fall EXAM: CT HEAD WITHOUT CONTRAST CT MAXILLOFACIAL WITHOUT CONTRAST CT CERVICAL SPINE WITHOUT CONTRAST TECHNIQUE: Multidetector CT imaging of the head, cervical spine, and maxillofacial structures were performed using the standard protocol without intravenous contrast. Multiplanar CT image reconstructions of the cervical spine and maxillofacial structures were also generated. RADIATION DOSE REDUCTION: This exam was performed according to the departmental dose-optimization program which includes automated exposure control, adjustment of the mA and/or kV according to patient size and/or use of iterative reconstruction technique. COMPARISON:  None Available. FINDINGS: CT HEAD FINDINGS Brain: No evidence of acute infarction, hemorrhage, hydrocephalus, extra-axial collection or mass lesion/mass effect. Vascular: No hyperdense vessel or unexpected calcification. CT FACIAL BONES FINDINGS Skull: Normal. Negative for fracture or focal lesion. Facial bones: Displaced fractures of the nasal bones (series 4, image 10). No other displaced fractures or dislocations. Sinuses/Orbits: No acute finding. Total opacification of the right sphenoid sinus with bony sinus wall thickening, likely  reflecting chronic sinusitis (series four, image eighteen). Other: Soft tissue edema of the nose. CT CERVICAL SPINE FINDINGS Alignment: Normal. Skull base and vertebrae: No acute fracture. No suspicious osseous lesions. Soft tissues and spinal canal: No prevertebral fluid or swelling. No visible canal hematoma. 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 Moderate to severe associated disc degenerative change at C4-C6. Upper chest: Negative. Other: None. IMPRESSION: 1. No acute intracranial pathology. 2. Displaced fractures of the nasal bones. No other displaced fractures or dislocations of the facial bones. 3. No fracture or static subluxation of the cervical spine. 4. 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 Moderate to severe associated disc degenerative change at C4-C6. 5. Total opacification of the right sphenoid sinus with bony sinus wall thickening, likely reflecting chronic sinusitis. Electronically Signed   By: Jearld Lesch M.D.   On: 07/07/2022 14:28   DG Chest 2 View  Result Date: 07/07/2022 CLINICAL DATA:  Trauma, fall EXAM: CHEST - 2 VIEW COMPARISON:  None Available. FINDINGS: Transverse diameter of heart is increased. There are no signs of pulmonary edema or focal pulmonary consolidation. There is no pleural effusion or pneumothorax. IMPRESSION: Cardiomegaly. There are no focal pulmonary infiltrates. There is no pleural effusion or pneumothorax. Electronically Signed   By: Ernie Avena M.D.   On: 07/07/2022 14:17   DG Shoulder Right  Result Date: 07/07/2022 CLINICAL DATA:  Fall. EXAM: RIGHT SHOULDER - 2+ VIEW COMPARISON:  None Available. FINDINGS: Three views of the right shoulder. No evidence of fracture or dislocation. There is no evidence of arthropathy or other focal bone abnormality. Soft tissues are unremarkable. IMPRESSION: Negative right shoulder radiographs. Electronically Signed   By:  Orvan Falconer M.D.   On: 07/07/2022 14:14   DG Finger Ring Right  Result Date: 07/07/2022 CLINICAL DATA:  Trauma, fall EXAM: RIGHT RING FINGER 2+V COMPARISON:  None Available. FINDINGS: No displaced fracture or dislocation is seen. Small bony spurs are seen in distal interphalangeal joint. Possible minimal bony spurs are seen in the PIP joint. IMPRESSION: No fracture or dislocation is seen. Degenerative changes are noted in the  distal interphalangeal joint. Electronically Signed   By: Ernie Avena M.D.   On: 07/07/2022 14:13    Pending Labs Unresulted Labs (From admission, onward)     Start     Ordered   07/08/22 0500  Basic metabolic panel  Tomorrow morning,   R        07/07/22 1825   07/08/22 0500  CBC  Tomorrow morning,   R        07/07/22 1825            Vitals/Pain Today's Vitals   07/07/22 1700 07/07/22 1725 07/07/22 1745 07/07/22 1800  BP: (!) 151/87  (!) 153/79 (!) 153/84  Pulse: 65  69 66  Resp: 16  (!) 0 (!) 5  Temp:  97.7 F (36.5 C)    TempSrc:  Oral    SpO2: 92%  92% 92%  Weight:      Height:        Isolation Precautions No active isolations  Medications Medications  aspirin chewable tablet 81 mg (has no administration in time range)  enoxaparin (LOVENOX) injection 40 mg (has no administration in time range)  0.9 %  sodium chloride infusion (has no administration in time range)  acetaminophen (TYLENOL) tablet 650 mg (has no administration in time range)    Or  acetaminophen (TYLENOL) suppository 650 mg (has no administration in time range)  traMADol (ULTRAM) tablet 50 mg (has no administration in time range)  traZODone (DESYREL) tablet 25 mg (has no administration in time range)  docusate sodium (COLACE) capsule 100 mg (has no administration in time range)  polyethylene glycol (MIRALAX / GLYCOLAX) packet 17 g (has no administration in time range)  ondansetron (ZOFRAN) tablet 4 mg (has no administration in time range)    Or  ondansetron  (ZOFRAN) injection 4 mg (has no administration in time range)  albuterol (PROVENTIL) (2.5 MG/3ML) 0.083% nebulizer solution 2.5 mg (has no administration in time range)  hydrALAZINE (APRESOLINE) injection 5 mg (has no administration in time range)  lactated ringers bolus 500 mL (0 mLs Intravenous Stopped 07/07/22 1617)  traMADol (ULTRAM) tablet 25 mg (25 mg Oral Given 07/07/22 1624)  lactated ringers bolus 500 mL (500 mLs Intravenous New Bag/Given 07/07/22 1733)    Mobility walks with device     Focused Assessments     R Recommendations: See Admitting Provider Note  Report given to:   Additional Notes:

## 2022-07-07 NOTE — ED Notes (Signed)
ED TO INPATIENT HANDOFF REPORT  ED Nurse Name and Phone #: Minerva Areola [phone]  S Name/Age/Gender Spencer Clark 82 y.o. male Room/Bed: 024C/024C  Code Status   Code Status: Full Code  Home/SNF/Other Home Patient oriented to: self, place, time, and situation Is this baseline? Yes   Triage Complete: Triage complete  Chief Complaint Hyperkalemia [E87.5]  Triage Note Pt BIB [facility] from a store. PT received a cortisone shot in his right shoulder for recent torn rotator cuff in mechanical fall about 2 months ago. He then went shopping with family and had a mechanical fall today, falling face forward. He has an abrasion to nose and a small lac  to forehead.  Bleeding controlled,no LOC, no known thinners.   Takes ASA losartan and metoprolol per family on scene.  EMS VS122/90 94% RA HR 52 RR 14   Allergies Allergies  Allergen Reactions   Amlodipine Swelling   Hydrochlorothiazide Hives, Swelling and Other (See Comments)   Spironolactone     Other reaction(s): other   Celebrex [Celecoxib] Other (See Comments)    Not to take due to kidney disease    Level of Care/Admitting Diagnosis ED Disposition     ED Disposition  Admit   Condition  --   Comment  Hospital Area: [facility] [[id number]]  Level of Care: Med-Surg [16]  May place patient in observation at [facility] or [facility] if equivalent level of care is available:: Yes  Covid Evaluation: Asymptomatic - no recent exposure (last 10 days) testing not required  Diagnosis: Hyperkalemia [147829]  Admitting Physician: Maryln Gottron [[id number]]  Attending Physician: Kirby Crigler, MIR [email or web address] [[id number]]          B Medical/Surgery History Past Medical History:  Diagnosis Date   Arthritis    Hypertension    Sleep apnea    Uses Cpap   Past Surgical History:  Procedure Laterality Date   HERNIA REPAIR     INGUINAL HERNIA REPAIR     LEG SURGERY     Plate in Right leg     A IV Location/Drains/Wounds Patient  Lines/Drains/Airways Status     Active Line/Drains/Airways     Name Placement date Placement time Site Days   Peripheral IV 07/07/22 22 G Left;Posterior Hand 07/07/22  1439  Hand  less than 1   Incision (Closed) 10/19/18 Back Other (Comment) 10/19/18  0904  -- 1357            Intake/Output Last 24 hours  Intake/Output Summary (Last 24 hours) at 07/07/2022 2055 Last data filed at 07/07/2022 1617 Gross per 24 hour  Intake 500 ml  Output --  Net 500 ml    Labs/Imaging Results for orders placed or performed during the hospital encounter of 07/07/22 (from the past 48 hour(s))  Ethanol     Status: None   Collection Time: 07/07/22  1:05 PM  Result Value Ref Range   Alcohol, Ethyl (B) <10 <10 mg/dL    Comment: (NOTE) Lowest detectable limit for serum alcohol is 10 mg/dL.  For medical purposes only. Performed at [facility], [address] [place], [place] [address]   Comprehensive metabolic panel     Status: Abnormal   Collection Time: 07/07/22  2:46 PM  Result Value Ref Range   Sodium 133 (L) 135 - 145 mmol/L   Potassium 5.8 (H) 3.5 - 5.1 mmol/L   Chloride 99 98 - 111 mmol/L   CO2 18 (L) 22 - 32 mmol/L   Glucose,  Bld 136 (H) 70 - 99 mg/dL    Comment: Glucose reference range applies only to samples taken after fasting for at least 8 hours.   BUN 38 (H) 8 - 23 mg/dL   Creatinine, Ser 1.61 (H) 0.61 - 1.24 mg/dL   Calcium 9.0 8.9 - 09.6 mg/dL   Total Protein 7.5 6.5 - 8.1 g/dL   Albumin 4.1 3.5 - 5.0 g/dL   AST 25 15 - 41 U/L   ALT 18 0 - 44 U/L   Alkaline Phosphatase 92 38 - 126 U/L   Total Bilirubin 0.8 0.3 - 1.2 mg/dL   GFR, Estimated 44 (L) >60 mL/min    Comment: (NOTE) Calculated using the CKD-EPI Creatinine Equation (2021)    Anion gap 16 (H) 5 - 15    Comment: Performed at [facility], [address] [place], [place] [address]  CBC with Differential     Status: Abnormal   Collection Time: 07/07/22  2:46 PM  Result Value Ref Range   WBC 6.6 4.0  - 10.5 K/uL   RBC 5.84 (H) 4.22 - 5.81 MIL/uL   Hemoglobin 17.5 (H) 13.0 - 17.0 g/dL   HCT 98.1 (H) 19.1 - 47.8 %   MCV 95.2 80.0 - 100.0 fL   MCH 30.0 26.0 - 34.0 pg   MCHC 31.5 30.0 - 36.0 g/dL   RDW 29.5 62.1 - 30.8 %   Platelets 137 (L) 150 - 400 K/uL   nRBC 0.0 0.0 - 0.2 %   Neutrophils Relative % 75 %   Neutro Abs 4.9 1.7 - 7.7 K/uL   Lymphocytes Relative 20 %   Lymphs Abs 1.3 0.7 - 4.0 K/uL   Monocytes Relative 3 %   Monocytes Absolute 0.2 0.1 - 1.0 K/uL   Eosinophils Relative 1 %   Eosinophils Absolute 0.1 0.0 - 0.5 K/uL   Basophils Relative 1 %   Basophils Absolute 0.0 0.0 - 0.1 K/uL   Immature Granulocytes 0 %   Abs Immature Granulocytes 0.02 0.00 - 0.07 K/uL    Comment: Performed at [facility], [address] [place], [place] [address]  CBG monitoring, ED     Status: Abnormal   Collection Time: 07/07/22  3:17 PM  Result Value Ref Range   Glucose-Capillary 139 (H) 70 - 99 mg/dL    Comment: Glucose reference range applies only to samples taken after fasting for at least 8 hours.   Comment 1 Notify RN    Comment 2 Document in Chart   Urinalysis, w/ Reflex to Culture (Infection Suspected) -Urine, Clean Catch     Status: None   Collection Time: 07/07/22  4:30 PM  Result Value Ref Range   Specimen Source URINE, CLEAN CATCH    Color, Urine YELLOW YELLOW   APPearance CLEAR CLEAR   Specific Gravity, Urine 1.015 1.005 - 1.030   pH 5.0 5.0 - 8.0   Glucose, UA NEGATIVE NEGATIVE mg/dL   Hgb urine dipstick NEGATIVE NEGATIVE   Bilirubin Urine NEGATIVE NEGATIVE   Ketones, ur NEGATIVE NEGATIVE mg/dL   Protein, ur NEGATIVE NEGATIVE mg/dL   Nitrite NEGATIVE NEGATIVE   Leukocytes,Ua NEGATIVE NEGATIVE   RBC / HPF 0-5 0 - 5 RBC/hpf   WBC, UA 0-5 0 - 5 WBC/hpf    Comment:        Reflex urine culture not performed if WBC <=10, OR if Squamous epithelial cells >5. If Squamous epithelial cells >5 suggest recollection.    Bacteria, UA NONE  SEEN NONE SEEN   Squamous  Epithelial / HPF 0-5 0 - 5 /HPF   Mucus PRESENT     Comment: Performed at [facility], [address], [place], [place] [address]   CT Head Wo Contrast  Result Date: 07/07/2022 CLINICAL DATA:  Fall EXAM: CT HEAD WITHOUT CONTRAST CT MAXILLOFACIAL WITHOUT CONTRAST CT CERVICAL SPINE WITHOUT CONTRAST TECHNIQUE: Multidetector CT imaging of the head, cervical spine, and maxillofacial structures were performed using the standard protocol without intravenous contrast. Multiplanar CT image reconstructions of the cervical spine and maxillofacial structures were also generated. RADIATION DOSE REDUCTION: This exam was performed according to the departmental dose-optimization program which includes automated exposure control, adjustment of the mA and/or kV according to patient size and/or use of iterative reconstruction technique. COMPARISON:  None Available. FINDINGS: CT HEAD FINDINGS Brain: No evidence of acute infarction, hemorrhage, hydrocephalus, extra-axial collection or mass lesion/mass effect. Vascular: No hyperdense vessel or unexpected calcification. CT FACIAL BONES FINDINGS Skull: Normal. Negative for fracture or focal lesion. Facial bones: Displaced fractures of the nasal bones (series 4, image 10). No other displaced fractures or dislocations. Sinuses/Orbits: No acute finding. Total opacification of the right sphenoid sinus with bony sinus wall thickening, likely reflecting chronic sinusitis (series four, image eighteen). Other: Soft tissue edema of the nose. CT CERVICAL SPINE FINDINGS Alignment: Normal. Skull base and vertebrae: No acute fracture. No suspicious osseous lesions. Soft tissues and spinal canal: No prevertebral fluid or swelling. No visible canal hematoma. 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 Moderate to severe associated disc degenerative change at C4-C6. Upper chest: Negative. Other: None. IMPRESSION: 1. No acute intracranial  pathology. 2. Displaced fractures of the nasal bones. No other displaced fractures or dislocations of the facial bones. 3. No fracture or static subluxation of the cervical spine. 4. 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 Moderate to severe associated disc degenerative change at C4-C6. 5. Total opacification of the right sphenoid sinus with bony sinus wall thickening, likely reflecting chronic sinusitis. Electronically Signed   By: Jearld Lesch M.D.   On: 07/07/2022 14:28   CT Cervical Spine Wo Contrast  Result Date: 07/07/2022 CLINICAL DATA:  Fall EXAM: CT HEAD WITHOUT CONTRAST CT MAXILLOFACIAL WITHOUT CONTRAST CT CERVICAL SPINE WITHOUT CONTRAST TECHNIQUE: Multidetector CT imaging of the head, cervical spine, and maxillofacial structures were performed using the standard protocol without intravenous contrast. Multiplanar CT image reconstructions of the cervical spine and maxillofacial structures were also generated. RADIATION DOSE REDUCTION: This exam was performed according to the departmental dose-optimization program which includes automated exposure control, adjustment of the mA and/or kV according to patient size and/or use of iterative reconstruction technique. COMPARISON:  None Available. FINDINGS: CT HEAD FINDINGS Brain: No evidence of acute infarction, hemorrhage, hydrocephalus, extra-axial collection or mass lesion/mass effect. Vascular: No hyperdense vessel or unexpected calcification. CT FACIAL BONES FINDINGS Skull: Normal. Negative for fracture or focal lesion. Facial bones: Displaced fractures of the nasal bones (series 4, image 10). No other displaced fractures or dislocations. Sinuses/Orbits: No acute finding. Total opacification of the right sphenoid sinus with bony sinus wall thickening, likely reflecting chronic sinusitis (series four, image eighteen). Other: Soft tissue edema of the nose. CT CERVICAL SPINE FINDINGS Alignment: Normal. Skull base  and vertebrae: No acute fracture. No suspicious osseous lesions. Soft tissues and spinal canal: No prevertebral fluid or swelling. No visible canal hematoma. 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 Moderate to severe associated disc degenerative change at C4-C6. Upper chest: Negative. Other: None. IMPRESSION: 1. No acute intracranial pathology. 2. Displaced fractures of the nasal bones. No other displaced fractures or dislocations of the facial bones. 3. No fracture or static subluxation of the cervical spine. 4. 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 Moderate to severe associated disc degenerative change at C4-C6. 5. Total opacification of the right sphenoid sinus with bony sinus wall thickening, likely reflecting chronic sinusitis. Electronically Signed   By: Jearld Lesch M.D.   On: 07/07/2022 14:28   CT Maxillofacial Wo Contrast  Result Date: 07/07/2022 CLINICAL DATA:  Fall EXAM: CT HEAD WITHOUT CONTRAST CT MAXILLOFACIAL WITHOUT CONTRAST CT CERVICAL SPINE WITHOUT CONTRAST TECHNIQUE: Multidetector CT imaging of the head, cervical spine, and maxillofacial structures were performed using the standard protocol without intravenous contrast. Multiplanar CT image reconstructions of the cervical spine and maxillofacial structures were also generated. RADIATION DOSE REDUCTION: This exam was performed according to the departmental dose-optimization program which includes automated exposure control, adjustment of the mA and/or kV according to patient size and/or use of iterative reconstruction technique. COMPARISON:  None Available. FINDINGS: CT HEAD FINDINGS Brain: No evidence of acute infarction, hemorrhage, hydrocephalus, extra-axial collection or mass lesion/mass effect. Vascular: No hyperdense vessel or unexpected calcification. CT FACIAL BONES FINDINGS Skull: Normal. Negative for fracture or focal lesion. Facial  bones: Displaced fractures of the nasal bones (series 4, image 10). No other displaced fractures or dislocations. Sinuses/Orbits: No acute finding. Total opacification of the right sphenoid sinus with bony sinus wall thickening, likely reflecting chronic sinusitis (series four, image eighteen). Other: Soft tissue edema of the nose. CT CERVICAL SPINE FINDINGS Alignment: Normal. Skull base and vertebrae: No acute fracture. No suspicious osseous lesions. Soft tissues and spinal canal: No prevertebral fluid or swelling. No visible canal hematoma. 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 Moderate to severe associated disc degenerative change at C4-C6. Upper chest: Negative. Other: None. IMPRESSION: 1. No acute intracranial pathology. 2. Displaced fractures of the nasal bones. No other displaced fractures or dislocations of the facial bones. 3. No fracture or static subluxation of the cervical spine. 4. 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 Moderate to severe associated disc degenerative change at C4-C6. 5. Total opacification of the right sphenoid sinus with bony sinus wall thickening, likely reflecting chronic sinusitis. Electronically Signed   By: Jearld Lesch M.D.   On: 07/07/2022 14:28   DG Chest 2 View  Result Date: 07/07/2022 CLINICAL DATA:  Trauma, fall EXAM: CHEST - 2 VIEW COMPARISON:  None Available. FINDINGS: Transverse diameter of heart is increased. There are no signs of pulmonary edema or focal pulmonary consolidation. There is no pleural effusion or pneumothorax. IMPRESSION: Cardiomegaly. There are no focal pulmonary infiltrates. There is no pleural effusion or pneumothorax. Electronically Signed   By: Ernie Avena M.D.   On: 07/07/2022 14:17   DG Shoulder Right  Result Date: 07/07/2022 CLINICAL DATA:  Fall. EXAM: RIGHT SHOULDER - 2+ VIEW COMPARISON:  None Available. FINDINGS: Three views of  the right shoulder. No evidence of fracture or dislocation. There is no evidence of arthropathy or other focal bone abnormality. Soft tissues are unremarkable. IMPRESSION: Negative right shoulder radiographs. Electronically Signed   By: Orvan Falconer M.D.   On: 07/07/2022 14:14   DG Finger Ring Right  Result Date: 07/07/2022 CLINICAL DATA:  Trauma, fall EXAM: RIGHT RING FINGER 2+V  COMPARISON:  None Available. FINDINGS: No displaced fracture or dislocation is seen. Small bony spurs are seen in distal interphalangeal joint. Possible minimal bony spurs are seen in the PIP joint. IMPRESSION: No fracture or dislocation is seen. Degenerative changes are noted in the distal interphalangeal joint. Electronically Signed   By: Ernie Avena M.D.   On: 07/07/2022 14:13    Pending Labs Unresulted Labs (From admission, onward)     Start     Ordered   07/08/22 0500  Basic metabolic panel  Tomorrow morning,   R        07/07/22 1825   07/08/22 0500  CBC  Tomorrow morning,   R        07/07/22 1825            Vitals/Pain Today's Vitals   07/07/22 1947 07/07/22 2014 07/07/22 2015 07/07/22 2027  BP:   (!) 173/82   Pulse:   64   Resp:   17   Temp:   97.8 F (36.6 C)   TempSrc:   Oral   SpO2:   94%   Weight:      Height:      PainSc: 8  8   4      Isolation Precautions No active isolations  Medications Medications  aspirin chewable tablet 81 mg (has no administration in time range)  enoxaparin (LOVENOX) injection 40 mg (40 mg Subcutaneous Given 07/07/22 1957)  0.9 %  sodium chloride infusion ( Intravenous New Bag/Given 07/07/22 1955)  acetaminophen (TYLENOL) tablet 650 mg (650 mg Oral Given 07/07/22 1957)    Or  acetaminophen (TYLENOL) suppository 650 mg ( Rectal See Alternative 07/07/22 1957)  traMADol (ULTRAM) tablet 50 mg (50 mg Oral Given 07/07/22 1957)  traZODone (DESYREL) tablet 25 mg (has no administration in time range)  docusate sodium (COLACE) capsule 100 mg (has no  administration in time range)  polyethylene glycol (MIRALAX / GLYCOLAX) packet 17 g (has no administration in time range)  ondansetron (ZOFRAN) tablet 4 mg (has no administration in time range)    Or  ondansetron (ZOFRAN) injection 4 mg (has no administration in time range)  albuterol (PROVENTIL) (2.5 MG/3ML) 0.083% nebulizer solution 2.5 mg (has no administration in time range)  hydrALAZINE (APRESOLINE) injection 5 mg (has no administration in time range)  lactated ringers bolus 500 mL (0 mLs Intravenous Stopped 07/07/22 1617)  traMADol (ULTRAM) tablet 25 mg (25 mg Oral Given 07/07/22 1624)  lactated ringers bolus 500 mL (0 mLs Intravenous Stopped 07/07/22 2013)    Mobility walks     Focused Assessments Cardiac Assessment Handoff:    No results found for: "CKTOTAL", "CKMB", "CKMBINDEX", "TROPONINI" No results found for: "DDIMER" Does the Patient currently have chest pain? No    R Recommendations: See Admitting Provider Note  Report given to:   Additional Notes: A and o x 4, shuffling gait baseline but to weak now

## 2022-07-07 NOTE — ED Triage Notes (Signed)
Pt BIB [facility] from a store. PT received a cortisone shot in his right shoulder for recent torn rotator cuff in mechanical fall about 2 months ago. He then went shopping with family and had a mechanical fall today, falling face forward. He has an abrasion to nose and a small lac  to forehead.  Bleeding controlled,no LOC, no known thinners.   Takes ASA losartan and metoprolol per family on scene.  EMS VS122/90 94% RA HR 52 RR 14

## 2022-07-07 NOTE — ED Provider Notes (Signed)
[place] EMERGENCY DEPARTMENT AT [facility] Provider Note   CSN: [id number] Arrival date & time: 07/07/22  1213     History  No chief complaint on file.   Spencer Clark is a 82 y.o. male.  HPI 82 year old male presents with a fall. Patient tripped over a rug going into a store. He hit the door face first. Might have briefly lost consciousness according to wife.  Has noticed swelling and some abrasions to his forehead and nose.  He was bleeding but it seemed to be exterior to his nose.  Also has a scrape to his right ring finger.  He had just gotten a shot into his right shoulder that he states was feeling better until the fall.  He is on a baby aspirin but no blood thinners.  Wife and daughter are concerned because he has a chronic history of difficult ambulation but seems to be a little worse since yesterday and he has had some slurred speech since yesterday.  When trying to press her on an exact time of the onset of slurred speech, it seems like it was much worse yesterday but maybe has been creeping up for a while.  He has not had a fever, vomiting, urinary complaints.  He has a chronic but unchanged cough. No facial droop.  Home Medications Prior to Admission medications   Medication Sig Start Date End Date Taking? Authorizing Provider  acetaminophen (TYLENOL) 500 MG tablet Take 1,000 mg by mouth every 6 (six) hours as needed for moderate pain or headache.    [provider]  aspirin 81 MG chewable tablet Chew 81 mg by mouth daily.    [provider]  celecoxib (CELEBREX) 200 MG capsule Take 200 mg by mouth 2 (two) times daily.    [provider]  methocarbamol (ROBAXIN) 500 MG tablet Take 1 tablet (500 mg total) by mouth every 6 (six) hours as needed for muscle spasms. 10/21/18   Maeola Harman, MD  metoprolol tartrate (LOPRESSOR) 100 MG tablet Take 100 mg by mouth 2 (two) times daily.    [provider]  Multiple Vitamin (MULTIVITAMIN  WITH MINERALS) TABS tablet Take 1 tablet by mouth daily.    [provider]  Multiple Vitamins-Minerals (PRESERVISION AREDS 2) CAPS Take 1 capsule by mouth daily.    [provider]  oxyCODONE (OXY IR/ROXICODONE) 5 MG immediate release tablet Take 1-2 tablets (5-10 mg total) by mouth every 3 (three) hours as needed for severe pain ((score 4 to 6)). 10/21/18   Maeola Harman, MD  Testosterone Cypionate 200 MG/ML SOLN Inject 100 mg as directed every 14 (fourteen) days.    [provider]  Vitamin D, Ergocalciferol, (DRISDOL) 1.25 MG (50000 UT) CAPS capsule Take 50,000 Units by mouth every Monday.    [provider]      Allergies    Patient has no known allergies.    Review of Systems   Review of Systems  HENT:  Positive for facial swelling.   Gastrointestinal:  Negative for vomiting.  Musculoskeletal:  Positive for gait problem. Negative for neck pain.  Neurological:  Positive for speech difficulty. Negative for headaches.    Physical Exam Updated Vital Signs BP (!) 166/89 (BP Location: Right Arm)   Pulse (!) 58   Temp (!) 97.5 F (36.4 C) (Oral)   Resp 15   Ht 5\' 6"  (1.676 m)   Wt 99.8 kg   SpO2 95%   BMI 35.51 kg/m  Physical  Exam Vitals and nursing note reviewed.  Constitutional:      Appearance: He is well-developed.  HENT:     Head: Normocephalic. Abrasion and contusion present.      Nose: Nasal tenderness present.     Right Nostril: No epistaxis or septal hematoma.     Left Nostril: No septal hematoma.      Mouth/Throat:     Comments: No mandibular or maxillofacial tenderness besides nose Cardiovascular:     Rate and Rhythm: Normal rate and regular rhythm.     Heart sounds: Normal heart sounds.  Pulmonary:     Effort: Pulmonary effort is normal.     Breath sounds: Normal breath sounds.  Abdominal:     Palpations: Abdomen is soft.     Tenderness: There is no abdominal tenderness.  Musculoskeletal:     Cervical back: No spinous  process tenderness or muscular tenderness.     Comments: Small abrasion to dorsal proximal right ring finger  Skin:    General: Skin is warm and dry.  Neurological:     Mental Status: He is alert.     Comments: Patient has equal strength in all 4 extremities. No facial droop. I do not appreciate any slurred speech     ED Results / Procedures / Treatments   Labs (all labs ordered are listed, but only abnormal results are displayed) Labs Reviewed  CBG MONITORING, ED - Abnormal; Notable for the following components:      Result Value   Glucose-Capillary 139 (*)    All other components within normal limits  COMPREHENSIVE METABOLIC PANEL  CBC WITH DIFFERENTIAL/PLATELET  ETHANOL  URINALYSIS, W/ REFLEX TO CULTURE (INFECTION SUSPECTED)    EKG EKG Interpretation  Date/Time:  Wednesday Jul 07 2022 14:41:31 EDT Ventricular Rate:  61 PR Interval:  209 QRS Duration: 106 QT Interval:  436 QTC Calculation: 440 R Axis:   -16 Text Interpretation: Sinus rhythm Borderline left axis deviation Low voltage, precordial leads Probable anteroseptal infarct, old Confirmed by Pricilla Loveless [phone]) on 07/07/2022 2:59:55 PM  Radiology CT Head Wo Contrast  Result Date: 07/07/2022 CLINICAL DATA:  Fall EXAM: CT HEAD WITHOUT CONTRAST CT MAXILLOFACIAL WITHOUT CONTRAST CT CERVICAL SPINE WITHOUT CONTRAST TECHNIQUE: Multidetector CT imaging of the head, cervical spine, and maxillofacial structures were performed using the standard protocol without intravenous contrast. Multiplanar CT image reconstructions of the cervical spine and maxillofacial structures were also generated. RADIATION DOSE REDUCTION: This exam was performed according to the departmental dose-optimization program which includes automated exposure control, adjustment of the mA and/or kV according to patient size and/or use of iterative reconstruction technique. COMPARISON:  None Available. FINDINGS: CT HEAD FINDINGS Brain: No evidence of acute  infarction, hemorrhage, hydrocephalus, extra-axial collection or mass lesion/mass effect. Vascular: No hyperdense vessel or unexpected calcification. CT FACIAL BONES FINDINGS Skull: Normal. Negative for fracture or focal lesion. Facial bones: Displaced fractures of the nasal bones (series 4, image 10). No other displaced fractures or dislocations. Sinuses/Orbits: No acute finding. Total opacification of the right sphenoid sinus with bony sinus wall thickening, likely reflecting chronic sinusitis (series four, image eighteen). Other: Soft tissue edema of the nose. CT CERVICAL SPINE FINDINGS Alignment: Normal. Skull base and vertebrae: No acute fracture. No suspicious osseous lesions. Soft tissues and spinal canal: No prevertebral fluid or swelling. No visible canal hematoma. 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 Moderate to severe associated disc degenerative change at C4-C6. Upper chest: Negative. Other: None. IMPRESSION: 1. No  acute intracranial pathology. 2. Displaced fractures of the nasal bones. No other displaced fractures or dislocations of the facial bones. 3. No fracture or static subluxation of the cervical spine. 4. 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 Moderate to severe associated disc degenerative change at C4-C6. 5. Total opacification of the right sphenoid sinus with bony sinus wall thickening, likely reflecting chronic sinusitis. Electronically Signed   By: Jearld Lesch M.D.   On: 07/07/2022 14:28   CT Cervical Spine Wo Contrast  Result Date: 07/07/2022 CLINICAL DATA:  Fall EXAM: CT HEAD WITHOUT CONTRAST CT MAXILLOFACIAL WITHOUT CONTRAST CT CERVICAL SPINE WITHOUT CONTRAST TECHNIQUE: Multidetector CT imaging of the head, cervical spine, and maxillofacial structures were performed using the standard protocol without intravenous contrast. Multiplanar CT image reconstructions of the cervical spine  and maxillofacial structures were also generated. RADIATION DOSE REDUCTION: This exam was performed according to the departmental dose-optimization program which includes automated exposure control, adjustment of the mA and/or kV according to patient size and/or use of iterative reconstruction technique. COMPARISON:  None Available. FINDINGS: CT HEAD FINDINGS Brain: No evidence of acute infarction, hemorrhage, hydrocephalus, extra-axial collection or mass lesion/mass effect. Vascular: No hyperdense vessel or unexpected calcification. CT FACIAL BONES FINDINGS Skull: Normal. Negative for fracture or focal lesion. Facial bones: Displaced fractures of the nasal bones (series 4, image 10). No other displaced fractures or dislocations. Sinuses/Orbits: No acute finding. Total opacification of the right sphenoid sinus with bony sinus wall thickening, likely reflecting chronic sinusitis (series four, image eighteen). Other: Soft tissue edema of the nose. CT CERVICAL SPINE FINDINGS Alignment: Normal. Skull base and vertebrae: No acute fracture. No suspicious osseous lesions. Soft tissues and spinal canal: No prevertebral fluid or swelling. No visible canal hematoma. 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 Moderate to severe associated disc degenerative change at C4-C6. Upper chest: Negative. Other: None. IMPRESSION: 1. No acute intracranial pathology. 2. Displaced fractures of the nasal bones. No other displaced fractures or dislocations of the facial bones. 3. No fracture or static subluxation of the cervical spine. 4. 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 Moderate to severe associated disc degenerative change at C4-C6. 5. Total opacification of the right sphenoid sinus with bony sinus wall thickening, likely reflecting chronic sinusitis. Electronically Signed   By: Jearld Lesch M.D.   On: 07/07/2022 14:28   CT  Maxillofacial Wo Contrast  Result Date: 07/07/2022 CLINICAL DATA:  Fall EXAM: CT HEAD WITHOUT CONTRAST CT MAXILLOFACIAL WITHOUT CONTRAST CT CERVICAL SPINE WITHOUT CONTRAST TECHNIQUE: Multidetector CT imaging of the head, cervical spine, and maxillofacial structures were performed using the standard protocol without intravenous contrast. Multiplanar CT image reconstructions of the cervical spine and maxillofacial structures were also generated. RADIATION DOSE REDUCTION: This exam was performed according to the departmental dose-optimization program which includes automated exposure control, adjustment of the mA and/or kV according to patient size and/or use of iterative reconstruction technique. COMPARISON:  None Available. FINDINGS: CT HEAD FINDINGS Brain: No evidence of acute infarction, hemorrhage, hydrocephalus, extra-axial collection or mass lesion/mass effect. Vascular: No hyperdense vessel or unexpected calcification. CT FACIAL BONES FINDINGS Skull: Normal. Negative for fracture or focal lesion. Facial bones: Displaced fractures of the nasal bones (series 4, image 10). No other displaced fractures or dislocations. Sinuses/Orbits: No acute finding. Total opacification of the right sphenoid sinus with bony sinus wall thickening, likely reflecting chronic sinusitis (series four, image eighteen). Other: Soft tissue edema of  the nose. CT CERVICAL SPINE FINDINGS Alignment: Normal. Skull base and vertebrae: No acute fracture. No suspicious osseous lesions. Soft tissues and spinal canal: No prevertebral fluid or swelling. No visible canal hematoma. 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 Moderate to severe associated disc degenerative change at C4-C6. Upper chest: Negative. Other: None. IMPRESSION: 1. No acute intracranial pathology. 2. Displaced fractures of the nasal bones. No other displaced fractures or dislocations of the facial bones. 3. No fracture or  static subluxation of the cervical spine. 4. 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 Moderate to severe associated disc degenerative change at C4-C6. 5. Total opacification of the right sphenoid sinus with bony sinus wall thickening, likely reflecting chronic sinusitis. Electronically Signed   By: Jearld Lesch M.D.   On: 07/07/2022 14:28   DG Chest 2 View  Result Date: 07/07/2022 CLINICAL DATA:  Trauma, fall EXAM: CHEST - 2 VIEW COMPARISON:  None Available. FINDINGS: Transverse diameter of heart is increased. There are no signs of pulmonary edema or focal pulmonary consolidation. There is no pleural effusion or pneumothorax. IMPRESSION: Cardiomegaly. There are no focal pulmonary infiltrates. There is no pleural effusion or pneumothorax. Electronically Signed   By: Ernie Avena M.D.   On: 07/07/2022 14:17   DG Shoulder Right  Result Date: 07/07/2022 CLINICAL DATA:  Fall. EXAM: RIGHT SHOULDER - 2+ VIEW COMPARISON:  None Available. FINDINGS: Three views of the right shoulder. No evidence of fracture or dislocation. There is no evidence of arthropathy or other focal bone abnormality. Soft tissues are unremarkable. IMPRESSION: Negative right shoulder radiographs. Electronically Signed   By: Orvan Falconer M.D.   On: 07/07/2022 14:14   DG Finger Ring Right  Result Date: 07/07/2022 CLINICAL DATA:  Trauma, fall EXAM: RIGHT RING FINGER 2+V COMPARISON:  None Available. FINDINGS: No displaced fracture or dislocation is seen. Small bony spurs are seen in distal interphalangeal joint. Possible minimal bony spurs are seen in the PIP joint. IMPRESSION: No fracture or dislocation is seen. Degenerative changes are noted in the distal interphalangeal joint. Electronically Signed   By: Ernie Avena M.D.   On: 07/07/2022 14:13    Procedures Procedures    Medications Ordered in ED Medications  lactated ringers bolus 500 mL (500 mLs Intravenous New Bag/Given  07/07/22 1443)    ED Course/ Medical Decision Making/ A&P                             Medical Decision Making Amount and/or Complexity of Data Reviewed Independent Historian: spouse Labs: ordered. Radiology: ordered and independent interpretation performed.    Details: Nasal fracture. No head bleed.  ECG/medicine tests: ordered and independent interpretation performed.    Details: No ischemia   Patient with a mechanical fall.  He appears to have a closed nasal bone fracture.  Otherwise, imaging is unremarkable.  He has a small abrasion to his right ring finger but no indication for laceration repair.  Spouse and family are indicating that he had some slurred speech but otherwise was not having facial droop or weakness.  Some difficulty with balance but this has been an acute on chronic problem.  Also unclear when exactly this started as it was worst yesterday but has been ongoing for a few days before that, progressively worsened and now is no longer currently present.  I think TIA is unlikely.  We discussed that TIA would be  less likely with just isolated slurred speech, which could be from medicines, dehydration, etc. Given no other symptoms will hold off on MRI (esp since asymptomatic now). Family/patient in agreement. Will give some fluids and eval labs.   Will attempt to ambulate, and care transferred to Dr. Fredderick Phenix.        Final Clinical Impression(s) / ED Diagnoses Final diagnoses:  None    Rx / DC Orders ED Discharge Orders     None         Pricilla Loveless, MD 07/07/22 1552

## 2022-07-07 NOTE — H&P (Signed)
History and Physical  Spencer Clark ZOX:[id number] DOB: 09-09-1940 DOA: 07/07/2022  PCP: Zella Ball, MD   Chief Complaint: Fall  HPI: Spencer Clark is a 82 y.o. male with medical history significant for hypertension, CKD stage III baseline creatinine about 1.5 according to family, who had a mechanical fall today and fractured a nasal bone.  He was brought to the ER for evaluation after this.  On discussion with family including his wife and daughter who is at the bedside, patient has had progressive difficulty with ambulation over the last several months and weeks, today he was in [place] to see an orthopedic surgeon and had a steroid injection in his right shoulder.  He lives in [place].  In any case, after getting the injection he was feeling pretty good and they went shopping, unfortunately there he tripped over a thin carpet and hit his face on the front door of the business.  EMS was called and he was brought to the ER for evaluation.  ED Course: In the emergency department, he was noted to be afebrile, hypertensive to 160s over 90s, otherwise hemodynamically stable with normal vital signs.  Lab work significant for creatinine 1.57 (this is baseline per daughter at the bedside), and potassium 5.8.  Patient was given oral tramadol for pain, imaging confirmed nasal bone fracture, but no evidence of acute stroke or other intracranial, cervical abnormality.  Hospitalist was contacted for observation admission for hyperkalemia.  Review of Systems: Please see HPI for pertinent positives and negatives. A complete 10 system review of systems are otherwise negative.  Past Medical History:  Diagnosis Date   Arthritis    Hypertension    Sleep apnea    Uses Cpap   Past Surgical History:  Procedure Laterality Date   HERNIA REPAIR     INGUINAL HERNIA REPAIR     LEG SURGERY     Plate in Right leg    Social History:  reports that he has never smoked. He has never used  smokeless tobacco. He reports that he does not currently use alcohol. He reports that he does not use drugs.   No Known Allergies  History reviewed. No pertinent family history.   Prior to Admission medications   Medication Sig Start Date End Date Taking? Authorizing Provider  acetaminophen (TYLENOL) 500 MG tablet Take 1,000 mg by mouth every 6 (six) hours as needed for moderate pain or headache.    [provider]  aspirin 81 MG chewable tablet Chew 81 mg by mouth daily.    [provider]  celecoxib (CELEBREX) 200 MG capsule Take 200 mg by mouth 2 (two) times daily.    [provider]  methocarbamol (ROBAXIN) 500 MG tablet Take 1 tablet (500 mg total) by mouth every 6 (six) hours as needed for muscle spasms. 10/21/18   Maeola Harman, MD  metoprolol tartrate (LOPRESSOR) 100 MG tablet Take 100 mg by mouth 2 (two) times daily.    [provider]  Multiple Vitamin (MULTIVITAMIN WITH MINERALS) TABS tablet Take 1 tablet by mouth daily.    [provider]  Multiple Vitamins-Minerals (PRESERVISION AREDS 2) CAPS Take 1 capsule by mouth daily.    [provider]  oxyCODONE (OXY IR/ROXICODONE) 5 MG immediate release tablet Take 1-2 tablets (5-10 mg total) by mouth every 3 (three) hours as needed for severe pain ((score 4 to 6)). 10/21/18   Maeola Harman, MD  Testosterone Cypionate 200 MG/ML SOLN Inject 100 mg as directed every 14 (fourteen) days.  [provider]  Vitamin D, Ergocalciferol, (DRISDOL) 1.25 MG (50000 UT) CAPS capsule Take 50,000 Units by mouth every Monday.    [provider]    Physical Exam: BP (!) 153/84   Pulse 66   Temp 97.7 F (36.5 C) (Oral)   Resp (!) 5   Ht 5\' 6"  (1.676 m)   Wt 99.8 kg   SpO2 92%   BMI 35.51 kg/m   General:  Alert, oriented, calm, in no acute distress  Eyes: EOMI, clear conjuctivae, white sclerea Obvious nasal deformity with swelling and dried blood. Neck: supple, no masses,  trachea mildline  Cardiovascular: RRR, no murmurs or rubs, no peripheral edema  Respiratory: clear to auscultation bilaterally, no wheezes, no crackles  Abdomen: soft, nontender, nondistended, normal bowel tones heard  Skin: dry, no rashes  Musculoskeletal: no joint effusions, normal range of motion  Psychiatric: appropriate affect, normal speech  Neurologic: extraocular muscles intact, clear speech, moving all extremities with intact sensorium          Labs on Admission:  Basic Metabolic Panel: Recent Labs  Lab 07/07/22 1446  NA 133*  K 5.8*  CL 99  CO2 18*  GLUCOSE 136*  BUN 38*  CREATININE 1.57*  CALCIUM 9.0   Liver Function Tests: Recent Labs  Lab 07/07/22 1446  AST 25  ALT 18  ALKPHOS 92  BILITOT 0.8  PROT 7.5  ALBUMIN 4.1   No results for input(s): "LIPASE", "AMYLASE" in the last 168 hours. No results for input(s): "AMMONIA" in the last 168 hours. CBC: Recent Labs  Lab 07/07/22 1446  WBC 6.6  NEUTROABS 4.9  HGB 17.5*  HCT 55.6*  MCV 95.2  PLT 137*   Cardiac Enzymes: No results for input(s): "CKTOTAL", "CKMB", "CKMBINDEX", "TROPONINI" in the last 168 hours.  BNP (last 3 results) No results for input(s): "BNP" in the last 8760 hours.  ProBNP (last 3 results) No results for input(s): "PROBNP" in the last 8760 hours.  CBG: Recent Labs  Lab 07/07/22 1517  GLUCAP 139*    Radiological Exams on Admission: CT Head Wo Contrast  Result Date: 07/07/2022 CLINICAL DATA:  Fall EXAM: CT HEAD WITHOUT CONTRAST CT MAXILLOFACIAL WITHOUT CONTRAST CT CERVICAL SPINE WITHOUT CONTRAST TECHNIQUE: Multidetector CT imaging of the head, cervical spine, and maxillofacial structures were performed using the standard protocol without intravenous contrast. Multiplanar CT image reconstructions of the cervical spine and maxillofacial structures were also generated. RADIATION DOSE REDUCTION: This exam was performed according to the departmental dose-optimization program which  includes automated exposure control, adjustment of the mA and/or kV according to patient size and/or use of iterative reconstruction technique. COMPARISON:  None Available. FINDINGS: CT HEAD FINDINGS Brain: No evidence of acute infarction, hemorrhage, hydrocephalus, extra-axial collection or mass lesion/mass effect. Vascular: No hyperdense vessel or unexpected calcification. CT FACIAL BONES FINDINGS Skull: Normal. Negative for fracture or focal lesion. Facial bones: Displaced fractures of the nasal bones (series 4, image 10). No other displaced fractures or dislocations. Sinuses/Orbits: No acute finding. Total opacification of the right sphenoid sinus with bony sinus wall thickening, likely reflecting chronic sinusitis (series four, image eighteen). Other: Soft tissue edema of the nose. CT CERVICAL SPINE FINDINGS Alignment: Normal. Skull base and vertebrae: No acute fracture. No suspicious osseous lesions. Soft tissues and spinal canal: No prevertebral fluid or swelling. No visible canal hematoma. 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 Moderate to severe associated disc degenerative change at C4-C6. Upper chest: Negative. Other: None.  IMPRESSION: 1. No acute intracranial pathology. 2. Displaced fractures of the nasal bones. No other displaced fractures or dislocations of the facial bones. 3. No fracture or static subluxation of the cervical spine. 4. 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 Moderate to severe associated disc degenerative change at C4-C6. 5. Total opacification of the right sphenoid sinus with bony sinus wall thickening, likely reflecting chronic sinusitis. Electronically Signed   By: Jearld Lesch M.D.   On: 07/07/2022 14:28   CT Cervical Spine Wo Contrast  Result Date: 07/07/2022 CLINICAL DATA:  Fall EXAM: CT HEAD WITHOUT CONTRAST CT MAXILLOFACIAL WITHOUT CONTRAST CT CERVICAL SPINE WITHOUT  CONTRAST TECHNIQUE: Multidetector CT imaging of the head, cervical spine, and maxillofacial structures were performed using the standard protocol without intravenous contrast. Multiplanar CT image reconstructions of the cervical spine and maxillofacial structures were also generated. RADIATION DOSE REDUCTION: This exam was performed according to the departmental dose-optimization program which includes automated exposure control, adjustment of the mA and/or kV according to patient size and/or use of iterative reconstruction technique. COMPARISON:  None Available. FINDINGS: CT HEAD FINDINGS Brain: No evidence of acute infarction, hemorrhage, hydrocephalus, extra-axial collection or mass lesion/mass effect. Vascular: No hyperdense vessel or unexpected calcification. CT FACIAL BONES FINDINGS Skull: Normal. Negative for fracture or focal lesion. Facial bones: Displaced fractures of the nasal bones (series 4, image 10). No other displaced fractures or dislocations. Sinuses/Orbits: No acute finding. Total opacification of the right sphenoid sinus with bony sinus wall thickening, likely reflecting chronic sinusitis (series four, image eighteen). Other: Soft tissue edema of the nose. CT CERVICAL SPINE FINDINGS Alignment: Normal. Skull base and vertebrae: No acute fracture. No suspicious osseous lesions. Soft tissues and spinal canal: No prevertebral fluid or swelling. No visible canal hematoma. 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 Moderate to severe associated disc degenerative change at C4-C6. Upper chest: Negative. Other: None. IMPRESSION: 1. No acute intracranial pathology. 2. Displaced fractures of the nasal bones. No other displaced fractures or dislocations of the facial bones. 3. No fracture or static subluxation of the cervical spine. 4. 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 Moderate to severe associated disc  degenerative change at C4-C6. 5. Total opacification of the right sphenoid sinus with bony sinus wall thickening, likely reflecting chronic sinusitis. Electronically Signed   By: Jearld Lesch M.D.   On: 07/07/2022 14:28   CT Maxillofacial Wo Contrast  Result Date: 07/07/2022 CLINICAL DATA:  Fall EXAM: CT HEAD WITHOUT CONTRAST CT MAXILLOFACIAL WITHOUT CONTRAST CT CERVICAL SPINE WITHOUT CONTRAST TECHNIQUE: Multidetector CT imaging of the head, cervical spine, and maxillofacial structures were performed using the standard protocol without intravenous contrast. Multiplanar CT image reconstructions of the cervical spine and maxillofacial structures were also generated. RADIATION DOSE REDUCTION: This exam was performed according to the departmental dose-optimization program which includes automated exposure control, adjustment of the mA and/or kV according to patient size and/or use of iterative reconstruction technique. COMPARISON:  None Available. FINDINGS: CT HEAD FINDINGS Brain: No evidence of acute infarction, hemorrhage, hydrocephalus, extra-axial collection or mass lesion/mass effect. Vascular: No hyperdense vessel or unexpected calcification. CT FACIAL BONES FINDINGS Skull: Normal. Negative for fracture or focal lesion. Facial bones: Displaced fractures of the nasal bones (series 4, image 10). No other displaced fractures or dislocations. Sinuses/Orbits: No acute finding. Total opacification of the right sphenoid sinus with bony sinus wall thickening, likely reflecting chronic sinusitis (series four, image eighteen). Other: Soft  tissue edema of the nose. CT CERVICAL SPINE FINDINGS Alignment: Normal. Skull base and vertebrae: No acute fracture. No suspicious osseous lesions. Soft tissues and spinal canal: No prevertebral fluid or swelling. No visible canal hematoma. 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 Moderate to severe associated disc degenerative  change at C4-C6. Upper chest: Negative. Other: None. IMPRESSION: 1. No acute intracranial pathology. 2. Displaced fractures of the nasal bones. No other displaced fractures or dislocations of the facial bones. 3. No fracture or static subluxation of the cervical spine. 4. 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 Moderate to severe associated disc degenerative change at C4-C6. 5. Total opacification of the right sphenoid sinus with bony sinus wall thickening, likely reflecting chronic sinusitis. Electronically Signed   By: Jearld Lesch M.D.   On: 07/07/2022 14:28   DG Chest 2 View  Result Date: 07/07/2022 CLINICAL DATA:  Trauma, fall EXAM: CHEST - 2 VIEW COMPARISON:  None Available. FINDINGS: Transverse diameter of heart is increased. There are no signs of pulmonary edema or focal pulmonary consolidation. There is no pleural effusion or pneumothorax. IMPRESSION: Cardiomegaly. There are no focal pulmonary infiltrates. There is no pleural effusion or pneumothorax. Electronically Signed   By: Ernie Avena M.D.   On: 07/07/2022 14:17   DG Shoulder Right  Result Date: 07/07/2022 CLINICAL DATA:  Fall. EXAM: RIGHT SHOULDER - 2+ VIEW COMPARISON:  None Available. FINDINGS: Three views of the right shoulder. No evidence of fracture or dislocation. There is no evidence of arthropathy or other focal bone abnormality. Soft tissues are unremarkable. IMPRESSION: Negative right shoulder radiographs. Electronically Signed   By: Orvan Falconer M.D.   On: 07/07/2022 14:14   DG Finger Ring Right  Result Date: 07/07/2022 CLINICAL DATA:  Trauma, fall EXAM: RIGHT RING FINGER 2+V COMPARISON:  None Available. FINDINGS: No displaced fracture or dislocation is seen. Small bony spurs are seen in distal interphalangeal joint. Possible minimal bony spurs are seen in the PIP joint. IMPRESSION: No fracture or dislocation is seen. Degenerative changes are noted in the distal  interphalangeal joint. Electronically Signed   By: Ernie Avena M.D.   On: 07/07/2022 14:13    Assessment/Plan    Hyperkalemia-the setting of CKD 3, does not appear to be any contributing medications. Hydrate with normal saline overnight, recheck potassium in the morning    CKD (chronic kidney disease) stage 3, GFR 30-59 ml/min (HCC)-appears to be at baseline    Nasal fracture-Will need outpatient ENT follow-up near his home.  Tramadol as needed pain in the meantime.  Family is very concerned and would like to avoid anything much stronger, in the setting of his renal failure.  Weakness and fall-no focal deficit on exam, no cranial abnormality on head CT.  PT consult.  Hypertension-resume home metoprolol, hydralazine once reconciled by pharmacy staff  DVT prophylaxis: Lovenox     Code Status: Full Code  Consults called: None  Admission status: Observation  Time spent: 46 minutes  Susanna Benge Sharlette Dense MD [facility] Pager [phone]  If 7PM-7AM, please contact night-coverage [email or web address] Password [email or web address]  07/07/2022, 6:26 PM

## 2022-07-08 ENCOUNTER — Other Ambulatory Visit (HOSPITAL_COMMUNITY): Payer: Self-pay

## 2022-07-08 DIAGNOSIS — E875 Hyperkalemia: Secondary | ICD-10-CM | POA: Diagnosis not present

## 2022-07-08 LAB — BASIC METABOLIC PANEL
Anion gap: 10 (ref 5–15)
BUN: 35 mg/dL — ABNORMAL HIGH (ref 8–23)
CO2: 23 mmol/L (ref 22–32)
Calcium: 9.2 mg/dL (ref 8.9–10.3)
Chloride: 102 mmol/L (ref 98–111)
Creatinine, Ser: 1.58 mg/dL — ABNORMAL HIGH (ref 0.61–1.24)
GFR, Estimated: 43 mL/min — ABNORMAL LOW (ref >60)
Glucose, Bld: 144 mg/dL — ABNORMAL HIGH (ref 70–99)
Potassium: 4.9 mmol/L (ref 3.5–5.1)
Sodium: 135 mmol/L (ref 135–145)

## 2022-07-08 LAB — CBC
HCT: 50.4 % (ref 39.0–52.0)
Hemoglobin: 16.8 g/dL (ref 13.0–17.0)
MCH: 29.9 pg (ref 26.0–34.0)
MCHC: 33.3 g/dL (ref 30.0–36.0)
MCV: 89.8 fL (ref 80.0–100.0)
Platelets: 147 10*3/uL — ABNORMAL LOW (ref 150–400)
RBC: 5.61 MIL/uL (ref 4.22–5.81)
RDW: 13.1 % (ref 11.5–15.5)
WBC: 6.4 10*3/uL (ref 4.0–10.5)
nRBC: 0 % (ref 0.0–0.2)

## 2022-07-08 MED ORDER — LOSARTAN POTASSIUM 50 MG PO TABS
100.0000 mg | ORAL_TABLET | Freq: Every day | ORAL | Status: DC
  Administered 2022-07-08: 100 mg via ORAL
  Filled 2022-07-08: qty 2

## 2022-07-08 MED ORDER — ATORVASTATIN CALCIUM 10 MG PO TABS
10.0000 mg | ORAL_TABLET | Freq: Every day | ORAL | Status: DC
  Administered 2022-07-08: 10 mg via ORAL
  Filled 2022-07-08: qty 1

## 2022-07-08 MED ORDER — OXYCODONE-ACETAMINOPHEN 5-325 MG PO TABS
1.0000 | ORAL_TABLET | Freq: Four times a day (QID) | ORAL | Status: DC | PRN
  Administered 2022-07-08: 1 via ORAL
  Filled 2022-07-08: qty 1

## 2022-07-08 MED ORDER — METOPROLOL TARTRATE 50 MG PO TABS
100.0000 mg | ORAL_TABLET | Freq: Two times a day (BID) | ORAL | Status: DC
  Administered 2022-07-08: 100 mg via ORAL
  Filled 2022-07-08: qty 2

## 2022-07-08 MED ORDER — DULOXETINE HCL 30 MG PO CPEP
30.0000 mg | ORAL_CAPSULE | Freq: Every day | ORAL | Status: DC
  Administered 2022-07-08: 30 mg via ORAL
  Filled 2022-07-08: qty 1

## 2022-07-08 MED ORDER — OXYCODONE-ACETAMINOPHEN 5-325 MG PO TABS: 1.0000 | ORAL_TABLET | Freq: Two times a day (BID) | ORAL | 0 refills | Status: DC | PRN

## 2022-07-08 MED ORDER — OXYCODONE-ACETAMINOPHEN 5-325 MG PO TABS
1.0000 | ORAL_TABLET | Freq: Two times a day (BID) | ORAL | 0 refills | Status: AC | PRN
  Filled 2022-07-08: qty 4, 2d supply, fill #0

## 2022-07-08 MED ORDER — METHOCARBAMOL 500 MG PO TABS: 500.0000 mg | ORAL_TABLET | Freq: Four times a day (QID) | ORAL | Status: DC | PRN

## 2022-07-08 MED ORDER — METOPROLOL TARTRATE 50 MG PO TABS: 100.0000 mg | ORAL_TABLET | Freq: Two times a day (BID) | ORAL | Status: DC

## 2022-07-08 MED ORDER — HYDRALAZINE HCL 25 MG PO TABS
25.0000 mg | ORAL_TABLET | Freq: Three times a day (TID) | ORAL | Status: DC
  Administered 2022-07-08: 25 mg via ORAL
  Filled 2022-07-08: qty 1

## 2022-07-08 MED ORDER — HYDRALAZINE HCL 50 MG PO TABS: 50.0000 mg | ORAL_TABLET | Freq: Three times a day (TID) | ORAL | 0 refills | Status: AC

## 2022-07-08 MED ORDER — LOSARTAN POTASSIUM 50 MG PO TABS: 50.0000 mg | ORAL_TABLET | Freq: Every day | ORAL | 0 refills | Status: AC

## 2022-07-08 NOTE — Discharge Summary (Addendum)
Physician Discharge Summary  Sven Tribe ZOX:[id number] DOB: 1940-11-28 DOA: 07/07/2022  PCP: Zella Ball, MD  Admit date: 07/07/2022 Discharge date: 07/08/2022  Admitted From: Home Discharge disposition: Home with outpatient PT  Recommendations at discharge:  Avoid tramadol.  Can try Percocet as needed to see tolerance. For blood pressure as well as potassium control, I would cut back on losartan 50 mg daily and increase hydralazine to 50 mg 3 times daily. Follow-up with PCP as an outpatient   Brief narrative: Spencer Clark is a 82 y.o. male with PMH significant for obesity, OSA, HTN, CKD who lives at home with family. For the last several months, patient was having progressive difficulty with ambulation.  He had a fall 2 months ago leading to right rotator cuff injury. 5/29, patient was seen in orthopedics office, received a steroid injection in the right shoulder.  He immediately felt better and went shopping.  Unfortunately, at the store, he tripped over a thin carpet, lost his balance hitting his face on the front door of the business.  EMS noted an abrasion on his nose and a small laceration on his forehead.  Brought to the ED.  In the ED, patient was afebrile, heart rate in 60s, blood pressure 149/75, breathing on room air Labs with potassium 5.8, BUN/creatinine 38/1.57 Urinalysis unremarkable Blood alcohol level not elevated CT head, cervical spine, maxillofacial was obtained.  Significant finding being displaced fracture of the nasal bones.  No intracranial injury. Kept on observation because of elevated potassium and creatinine.  Subjective: Patient was seen and examined this morning.  Pleasant elderly Caucasian male.  Propped up in bed.  Not in distress.  Wife and daughter at bedside.  Feels much better than at presentation.  I had a discussion with patient and his family about his elevated potassium, creatinine and elevated blood pressure Chart reviewed In  the last 24 hours, no fever, blood pressure in 150s to 170s, breathing on room air  Assessment and plan: Nasal bone fracture Secondary to a fall.  No surgical intervention needed at this time. Can follow-up with ENT as an [place] pain management as below  CKD 3B Creatinine elevated but remains at baseline Recent Labs    07/07/22 1446 07/08/22 0420  BUN 38* 35*  CREATININE 1.57* 1.58*   Hyperkalemia Potassium was elevated to 5.8 on admission.   With overnight hydration, potassium level improved to 4.9 this morning.   Losartan dose reduced as discussed below. Recent Labs  Lab 07/07/22 1446 07/08/22 0420  K 5.8* 4.9   Hypertension PTA on metoprolol 100 mg twice daily hydralazine 25 mg 3 times daily, losartan 100 mg daily and Lasix 20 mg daily Family states that his blood pressure has lately been trending up.  Managed by PCP as an outpatient. Based on his elevated creatinine and elevated potassium, I would reduce the dose of losartan to 50 mg daily.  To improve his blood pressure, I will increase hydralazine to 50 mg 3 times daily.  I have advised to follow-up with PCP for further adjustment.  Chronic back pain On Robaxin as needed as well as tramadol as needed at home.  I would advise to avoid tramadol in the setting of chronic kidney disease.  Advised to use Percocet as needed.  Patient and family willing to try.  HLD Aspirin and Lipitor  Anxiety/depression Cymbalta  Progressive generalized weakness and fall Ongoing for the last several months. Seen by PT.  Outpatient PT referral given.Marland Kitchen  Right rotator cuff injury Received cortisone shot by orthopedics as an outpatient on 5/29  Wounds:  - Incision (Closed) 10/19/18 Back Other (Comment) (Active)  Date First Assessed/Time First Assessed: 10/19/18 0904   Location: Back  Location Orientation: Other (Comment)    Assessments 10/19/2018 11:15 AM 10/21/2018  7:40 AM  Dressing Type Honeycomb Honeycomb   Dressing Clean;Dry;Intact Clean;Dry;Intact  Site / Wound Assessment Dressing in place / Unable to assess Dressing in place / Unable to assess  Drainage Amount None None     No associated orders.     Wound / Incision (Open or Dehisced) 07/07/22 Laceration Nose laceration (Active)  Date First Assessed/Time First Assessed: 07/07/22 2200   Wound Type: Laceration  Location: Nose  Wound Description (Comments): laceration  Present on Admission: Yes    Assessments 07/07/2022 10:12 PM  Dressing Type None  Site / Wound Assessment Clean;Dry  Drainage Amount None     No associated orders.     Wound / Incision (Open or Dehisced) 07/07/22 Laceration Face Upper (Active)  Date First Assessed/Time First Assessed: 07/07/22 2200   Wound Type: Laceration  Location: Face  Location Orientation: Upper  Present on Admission: Yes    Assessments 07/07/2022 10:12 PM  Dressing Type None  Site / Wound Assessment Clean;Dry  Drainage Amount None     No associated orders.    Discharge Exam:   Vitals:   07/07/22 2015 07/07/22 2209 07/08/22 0009 07/08/22 0815  BP: (!) 173/82 (!) 179/87 (!) 153/87 (!) 162/87  Pulse: 64 78 74 63  Resp: 17 15 15    Temp: 97.8 F (36.6 C) (!) 97.5 F (36.4 C) (!) 97.4 F (36.3 C)   TempSrc: Oral Oral Oral   SpO2: 94% 96% 92% 95%  Weight:      Height:        Body mass index is 35.51 kg/m.  General exam: Elderly Caucasian male.  Not in distress Skin: No rashes, lesions or ulcers. HEENT: Improving swelling in the nasal bridge.  Mild ecchymosis on the left infraorbital area Lungs: Clear to auscultation bilaterally CVS: Regular rate and rhythm, no murmur GI/Abd soft, nontender, nondistended, bowel sound present CNS: Alert, awake, oriented x 3.  Hard of hearing Psychiatry: Mood appropriate Extremities: No pedal edema, no calf tenderness  Follow ups:    Follow-up Information     Elijah Birk Alycia Rossetti., MD. Call in 1 day.   Specialty: Otolaryngology Contact  information: [address] [place] [address] [phone]         Your Primary Care Physician. Call in 1 day.   Why: about your blood pressure                Discharge Instructions:   Discharge Instructions     Ambulatory referral to ENT   Complete by: As directed    Nasal bone fracture   Call MD for:  difficulty breathing, headache or visual disturbances   Complete by: As directed    Call MD for:  extreme fatigue   Complete by: As directed    Call MD for:  hives   Complete by: As directed    Call MD for:  persistant dizziness or light-headedness   Complete by: As directed    Call MD for:  persistant nausea and vomiting   Complete by: As directed    Call MD for:  severe uncontrolled pain   Complete by: As directed    Call MD for:  temperature >100.4   Complete  by: As directed    Diet - low sodium heart healthy   Complete by: As directed    Discharge instructions   Complete by: As directed    Recommendations at discharge:   Avoid tramadol.  Can try Percocet as needed to see tolerance.  For blood pressure as well as potassium control, I would cut back on losartan 50 mg daily and increase hydralazine to 50 mg 3 times daily.  Follow-up with PCP as an outpatient  General discharge instructions: Follow with Primary MD Bridgers, Lynwood Dawley, MD in 7 days  Please request your PCP  to go over your hospital tests, procedures, radiology results at the follow up. Please get your medicines reviewed and adjusted.  Your PCP may decide to repeat certain labs or tests as needed. Do not drive, operate heavy machinery, perform activities at heights, swimming or participation in water activities or provide baby sitting services if your were admitted for syncope or siezures until you have seen by Primary MD or a Neurologist and advised to do so again. [place] Controlled Substance Reporting System database was reviewed. Do not drive, operate heavy machinery, perform  activities at heights, swim, participate in water activities or provide baby-sitting services while on medications for pain, sleep and mood until your outpatient physician has reevaluated you and advised to do so again.  You are strongly recommended to comply with the dose, frequency and duration of prescribed medications. Activity: As tolerated with Full fall precautions use walker/cane & assistance as needed Avoid using any recreational substances like cigarette, tobacco, alcohol, or non-prescribed drug. If you experience worsening of your admission symptoms, develop shortness of breath, life threatening emergency, suicidal or homicidal thoughts you must seek medical attention immediately by calling 911 or calling your MD immediately  if symptoms less severe. You must read complete instructions/literature along with all the possible adverse reactions/side effects for all the medicines you take and that have been prescribed to you. Take any new medicine only after you have completely understood and accepted all the possible adverse reactions/side effects.  Wear Seat belts while driving. You were cared for by a hospitalist during your hospital stay. If you have any questions about your discharge medications or the care you received while you were in the hospital after you are discharged, you can call the unit and ask to speak with the hospitalist or the covering physician. Once you are discharged, your primary care physician will handle any further medical issues. Please note that NO REFILLS for any discharge medications will be authorized once you are discharged, as it is imperative that you return to your primary care physician (or establish a relationship with a primary care physician if you do not have one).   Discharge wound care:   Complete by: As directed    Increase activity slowly   Complete by: As directed        Discharge Medications:   Allergies as of 07/08/2022       Reactions    Amlodipine Swelling   Hydrochlorothiazide Hives, Swelling, Other (See Comments)   Spironolactone    Other reaction(s): other   Celebrex [celecoxib] Other (See Comments)   Not to take due to kidney disease        Medication List     STOP taking these medications    acetaminophen 500 MG tablet Commonly known as: TYLENOL   traMADol 50 MG tablet Commonly known as: ULTRAM       TAKE these medications  aspirin 81 MG chewable tablet Chew 81 mg by mouth daily.   atorvastatin 10 MG tablet Commonly known as: LIPITOR Take 10 mg by mouth daily.   cetirizine 10 MG tablet Commonly known as: ZYRTEC Take 10 mg by mouth daily as needed for allergies.   DULoxetine 30 MG capsule Commonly known as: CYMBALTA Take 30 mg by mouth daily.   furosemide 20 MG tablet Commonly known as: LASIX Take 20 mg by mouth daily.   hydrALAZINE 50 MG tablet Commonly known as: APRESOLINE Take 1 tablet (50 mg total) by mouth 3 (three) times daily. What changed:  medication strength how much to take   losartan 50 MG tablet Commonly known as: COZAAR Take 1 tablet (50 mg total) by mouth daily. What changed: how much to take   methocarbamol 500 MG tablet Commonly known as: ROBAXIN Take 1 tablet (500 mg total) by mouth every 6 (six) hours as needed for muscle spasms.   metoprolol tartrate 100 MG tablet Commonly known as: LOPRESSOR Take 100 mg by mouth 2 (two) times daily.   multivitamin with minerals Tabs tablet Take 1 tablet by mouth daily.   oxyCODONE-acetaminophen 5-325 MG tablet Commonly known as: PERCOCET/ROXICET Take 1 tablet by mouth every 12 (twelve) hours as needed for up to 2 days for severe pain or moderate pain.   PreserVision AREDS 2 Caps Take 1 capsule by mouth daily.   Testosterone Cypionate 200 MG/ML Soln Inject 100 mg as directed every 14 (fourteen) days.               Discharge Care Instructions  (From admission, onward)           Start     Ordered    07/08/22 0000  Discharge wound care:        07/08/22 1155             The results of significant diagnostics from this hospitalization (including imaging, microbiology, ancillary and laboratory) are listed below for reference.    Procedures and Diagnostic Studies:   CT Head Wo Contrast  Result Date: 07/07/2022 CLINICAL DATA:  Fall EXAM: CT HEAD WITHOUT CONTRAST CT MAXILLOFACIAL WITHOUT CONTRAST CT CERVICAL SPINE WITHOUT CONTRAST TECHNIQUE: Multidetector CT imaging of the head, cervical spine, and maxillofacial structures were performed using the standard protocol without intravenous contrast. Multiplanar CT image reconstructions of the cervical spine and maxillofacial structures were also generated. RADIATION DOSE REDUCTION: This exam was performed according to the departmental dose-optimization program which includes automated exposure control, adjustment of the mA and/or kV according to patient size and/or use of iterative reconstruction technique. COMPARISON:  None Available. FINDINGS: CT HEAD FINDINGS Brain: No evidence of acute infarction, hemorrhage, hydrocephalus, extra-axial collection or mass lesion/mass effect. Vascular: No hyperdense vessel or unexpected calcification. CT FACIAL BONES FINDINGS Skull: Normal. Negative for fracture or focal lesion. Facial bones: Displaced fractures of the nasal bones (series 4, image 10). No other displaced fractures or dislocations. Sinuses/Orbits: No acute finding. Total opacification of the right sphenoid sinus with bony sinus wall thickening, likely reflecting chronic sinusitis (series four, image eighteen). Other: Soft tissue edema of the nose. CT CERVICAL SPINE FINDINGS Alignment: Normal. Skull base and vertebrae: No acute fracture. No suspicious osseous lesions. Soft tissues and spinal canal: No prevertebral fluid or swelling. No visible canal hematoma. 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  Moderate to severe associated disc degenerative change at C4-C6. Upper chest: Negative. Other: None. IMPRESSION: 1. No acute intracranial pathology.  2. Displaced fractures of the nasal bones. No other displaced fractures or dislocations of the facial bones. 3. No fracture or static subluxation of the cervical spine. 4. 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 Moderate to severe associated disc degenerative change at C4-C6. 5. Total opacification of the right sphenoid sinus with bony sinus wall thickening, likely reflecting chronic sinusitis. Electronically Signed   By: Jearld Lesch M.D.   On: 07/07/2022 14:28   CT Cervical Spine Wo Contrast  Result Date: 07/07/2022 CLINICAL DATA:  Fall EXAM: CT HEAD WITHOUT CONTRAST CT MAXILLOFACIAL WITHOUT CONTRAST CT CERVICAL SPINE WITHOUT CONTRAST TECHNIQUE: Multidetector CT imaging of the head, cervical spine, and maxillofacial structures were performed using the standard protocol without intravenous contrast. Multiplanar CT image reconstructions of the cervical spine and maxillofacial structures were also generated. RADIATION DOSE REDUCTION: This exam was performed according to the departmental dose-optimization program which includes automated exposure control, adjustment of the mA and/or kV according to patient size and/or use of iterative reconstruction technique. COMPARISON:  None Available. FINDINGS: CT HEAD FINDINGS Brain: No evidence of acute infarction, hemorrhage, hydrocephalus, extra-axial collection or mass lesion/mass effect. Vascular: No hyperdense vessel or unexpected calcification. CT FACIAL BONES FINDINGS Skull: Normal. Negative for fracture or focal lesion. Facial bones: Displaced fractures of the nasal bones (series 4, image 10). No other displaced fractures or dislocations. Sinuses/Orbits: No acute finding. Total opacification of the right sphenoid sinus with bony sinus wall thickening, likely reflecting chronic  sinusitis (series four, image eighteen). Other: Soft tissue edema of the nose. CT CERVICAL SPINE FINDINGS Alignment: Normal. Skull base and vertebrae: No acute fracture. No suspicious osseous lesions. Soft tissues and spinal canal: No prevertebral fluid or swelling. No visible canal hematoma. 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 Moderate to severe associated disc degenerative change at C4-C6. Upper chest: Negative. Other: None. IMPRESSION: 1. No acute intracranial pathology. 2. Displaced fractures of the nasal bones. No other displaced fractures or dislocations of the facial bones. 3. No fracture or static subluxation of the cervical spine. 4. 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 Moderate to severe associated disc degenerative change at C4-C6. 5. Total opacification of the right sphenoid sinus with bony sinus wall thickening, likely reflecting chronic sinusitis. Electronically Signed   By: Jearld Lesch M.D.   On: 07/07/2022 14:28   CT Maxillofacial Wo Contrast  Result Date: 07/07/2022 CLINICAL DATA:  Fall EXAM: CT HEAD WITHOUT CONTRAST CT MAXILLOFACIAL WITHOUT CONTRAST CT CERVICAL SPINE WITHOUT CONTRAST TECHNIQUE: Multidetector CT imaging of the head, cervical spine, and maxillofacial structures were performed using the standard protocol without intravenous contrast. Multiplanar CT image reconstructions of the cervical spine and maxillofacial structures were also generated. RADIATION DOSE REDUCTION: This exam was performed according to the departmental dose-optimization program which includes automated exposure control, adjustment of the mA and/or kV according to patient size and/or use of iterative reconstruction technique. COMPARISON:  None Available. FINDINGS: CT HEAD FINDINGS Brain: No evidence of acute infarction, hemorrhage, hydrocephalus, extra-axial collection or mass lesion/mass effect. Vascular: No  hyperdense vessel or unexpected calcification. CT FACIAL BONES FINDINGS Skull: Normal. Negative for fracture or focal lesion. Facial bones: Displaced fractures of the nasal bones (series 4, image 10). No other displaced fractures or dislocations. Sinuses/Orbits: No acute finding. Total opacification of the right sphenoid sinus with bony sinus wall thickening, likely reflecting chronic sinusitis (series four, image eighteen). Other: Soft tissue edema of the nose. CT  CERVICAL SPINE FINDINGS Alignment: Normal. Skull base and vertebrae: No acute fracture. No suspicious osseous lesions. Soft tissues and spinal canal: No prevertebral fluid or swelling. No visible canal hematoma. 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 Moderate to severe associated disc degenerative change at C4-C6. Upper chest: Negative. Other: None. IMPRESSION: 1. No acute intracranial pathology. 2. Displaced fractures of the nasal bones. No other displaced fractures or dislocations of the facial bones. 3. No fracture or static subluxation of the cervical spine. 4. 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 Moderate to severe associated disc degenerative change at C4-C6. 5. Total opacification of the right sphenoid sinus with bony sinus wall thickening, likely reflecting chronic sinusitis. Electronically Signed   By: Jearld Lesch M.D.   On: 07/07/2022 14:28   DG Chest 2 View  Result Date: 07/07/2022 CLINICAL DATA:  Trauma, fall EXAM: CHEST - 2 VIEW COMPARISON:  None Available. FINDINGS: Transverse diameter of heart is increased. There are no signs of pulmonary edema or focal pulmonary consolidation. There is no pleural effusion or pneumothorax. IMPRESSION: Cardiomegaly. There are no focal pulmonary infiltrates. There is no pleural effusion or pneumothorax. Electronically Signed   By: Ernie Avena M.D.   On: 07/07/2022 14:17   DG Shoulder  Right  Result Date: 07/07/2022 CLINICAL DATA:  Fall. EXAM: RIGHT SHOULDER - 2+ VIEW COMPARISON:  None Available. FINDINGS: Three views of the right shoulder. No evidence of fracture or dislocation. There is no evidence of arthropathy or other focal bone abnormality. Soft tissues are unremarkable. IMPRESSION: Negative right shoulder radiographs. Electronically Signed   By: Orvan Falconer M.D.   On: 07/07/2022 14:14   DG Finger Ring Right  Result Date: 07/07/2022 CLINICAL DATA:  Trauma, fall EXAM: RIGHT RING FINGER 2+V COMPARISON:  None Available. FINDINGS: No displaced fracture or dislocation is seen. Small bony spurs are seen in distal interphalangeal joint. Possible minimal bony spurs are seen in the PIP joint. IMPRESSION: No fracture or dislocation is seen. Degenerative changes are noted in the distal interphalangeal joint. Electronically Signed   By: Ernie Avena M.D.   On: 07/07/2022 14:13     Labs:   Basic Metabolic Panel: Recent Labs  Lab 07/07/22 1446 07/08/22 0420  NA 133* 135  K 5.8* 4.9  CL 99 102  CO2 18* 23  GLUCOSE 136* 144*  BUN 38* 35*  CREATININE 1.57* 1.58*  CALCIUM 9.0 9.2   GFR Estimated Creatinine Clearance: 39.9 mL/min (A) (by C-G formula based on SCr of 1.58 mg/dL (H)). Liver Function Tests: Recent Labs  Lab 07/07/22 1446  AST 25  ALT 18  ALKPHOS 92  BILITOT 0.8  PROT 7.5  ALBUMIN 4.1   No results for input(s): "LIPASE", "AMYLASE" in the last 168 hours. No results for input(s): "AMMONIA" in the last 168 hours. Coagulation profile No results for input(s): "INR", "PROTIME" in the last 168 hours.  CBC: Recent Labs  Lab 07/07/22 1446 07/08/22 0420  WBC 6.6 6.4  NEUTROABS 4.9  --   HGB 17.5* 16.8  HCT 55.6* 50.4  MCV 95.2 89.8  PLT 137* 147*   Cardiac Enzymes: No results for input(s): "CKTOTAL", "CKMB", "CKMBINDEX", "TROPONINI" in the last 168 hours. BNP: Invalid input(s): "POCBNP" CBG: Recent Labs  Lab 07/07/22 1517  GLUCAP  139*   D-Dimer No results for input(s): "DDIMER" in the last 72 hours. Hgb A1c No results for input(s): "HGBA1C" in the last 72 hours. Lipid Profile No  results for input(s): "CHOL", "HDL", "LDLCALC", "TRIG", "CHOLHDL", "LDLDIRECT" in the last 72 hours. Thyroid function studies No results for input(s): "TSH", "T4TOTAL", "T3FREE", "THYROIDAB" in the last 72 hours.  Invalid input(s): "FREET3" Anemia work up No results for input(s): "VITAMINB12", "FOLATE", "FERRITIN", "TIBC", "IRON", "RETICCTPCT" in the last 72 hours. Microbiology No results found for this or any previous visit (from the past 240 hour(s)).  Time coordinating discharge: 45 minutes  Signed: Ikeisha Blumberg  [facility] 07/08/2022, 1:00 PM

## 2022-07-08 NOTE — TOC CAGE-AID Note (Signed)
Transition of Care [facility]) - CAGE-AID Screening   Patient Details  Name: Spencer Clark MRN: [id number] Date of Birth: 07-Jan-1941  Transition of Care (TOC) CM/SW Contact:    Erin Sons, LCSW Phone Number: 07/08/2022, 9:54 AM   CAGE-AID Screening:    Have You Ever Felt You Ought to Cut Down on Your Drinking or Drug Use?: No Have People Annoyed You By Critizing Your Drinking Or Drug Use?: No Have You Felt Bad Or Guilty About Your Drinking Or Drug Use?: No Have You Ever Had a Drink or Used Drugs First Thing In The Morning to Steady Your Nerves or to Get Rid of a Hangover?: No CAGE-AID Score: 0  Substance Abuse Education Offered: No

## 2022-07-08 NOTE — TOC Transition Note (Signed)
Transition of Care [facility]) - CM/SW Discharge Note   Patient Details  Name: Spencer Clark MRN: [id number] Date of Birth: [birth year]  Transition of Care [facility] CM/SW Contact:  Janae Bridgeman, RN Phone Number: 07/08/2022, 1:24 PM   Clinical Narrative:    CM met with the patient at the bedside to discuss TOC needs prior to discharging home.  The patient and wife were visiting the daughter in [place] and admitted to the hospital for fall in the community.  The patient was active with [facility] health for PT at home.    PT evaluated the patient and they asked that a referral be placed for Outpatient therapy in the patient's hometown, [place], [place].  Outpatient Referral placed as an external referral and was co-signed by the attending physician, Dr. Pola Corn.  I called [facility] and spoke with Revonda Standard, PT and she accepted the referral and asked that clinicals and OUtpatient order be sent to fax # [phone], phone number [phone].  The patient's family was updated and given a copy to take home just in case referral is not received via the fax machine.  The patient's family will provide transportation home via car.  Patient has DME at the home - including RW and cane.   Final next level of care: OP Rehab Barriers to Discharge: No Barriers Identified   Patient Goals and CMS Choice CMS Medicare.gov Compare Post Acute Care list provided to:: Patient Choice offered to / list presented to : Patient  Discharge Placement                         Discharge Plan and Services Additional resources added to the After Visit Summary for     Discharge Planning Services: CM Consult Post Acute Care Choice: Resumption of Svcs/PTA Provider                               Social Determinants of Health (SDOH) Interventions SDOH Screenings   Food Insecurity: No Food Insecurity (07/07/2022)  Housing: Low Risk  (07/07/2022)   Transportation Needs: No Transportation Needs (07/07/2022)  Utilities: Not At Risk (07/07/2022)  Tobacco Use: Low Risk  (07/07/2022)     Readmission Risk Interventions     No data to display

## 2022-07-08 NOTE — Evaluation (Signed)
Physical Therapy Evaluation Patient Details Name: Spencer Clark MRN: [id number] DOB: 1941-02-07 Today's Date: 07/08/2022  History of Present Illness  82 yo male [facility] with fall after tripping over rug in community. Pt sustained closed nasal bone fx. PMH includes HTN, CKDII, recent R shoulder injection.  Clinical Impression   Pt presents with generalized weakness, impaired balance, decreased activity tolerance. Pt to benefit from acute PT to address deficits. Pt ambulated hallway distance, with and without RW, with supervision for safety. PT recommending PRN use of RW for balance, and OPPT to address balance and strength deficits. PT to progress mobility as tolerated, and will continue to follow acutely.         Recommendations for follow up therapy are one component of a multi-disciplinary discharge planning process, led by the attending physician.  Recommendations may be updated based on patient status, additional functional criteria and insurance authorization.  Follow Up Recommendations       Assistance Recommended at Discharge PRN  Patient can return home with the following  A little help with walking and/or transfers;A little help with bathing/dressing/bathroom    Equipment Recommendations None recommended by PT  Recommendations for Other Services       Functional Status Assessment Patient has had a recent decline in their functional status and demonstrates the ability to make significant improvements in function in a reasonable and predictable amount of time.     Precautions / Restrictions Precautions Precautions: Fall Precaution Comments: recent R shoulder injection for RTC tear Restrictions Weight Bearing Restrictions: No      Mobility  Bed Mobility Overal bed mobility: Needs Assistance Bed Mobility: Supine to Sit, Sit to Supine     Supine to sit: Supervision, HOB elevated Sit to supine: Supervision, HOB elevated        Transfers Overall  transfer level: Needs assistance Equipment used: Rolling walker (2 wheels) Transfers: Sit to/from Stand Sit to Stand: Supervision                Ambulation/Gait Ambulation/Gait assistance: Supervision Gait Distance (Feet): 400 Feet Assistive device: Rolling walker (2 wheels), None Gait Pattern/deviations: Step-through pattern, Decreased stride length, Shuffle Gait velocity: decr     General Gait Details: shuffling steps R>L, responds well to verbal cuing to pick feet up. Cues for placement in RW when using  Stairs            Wheelchair Mobility    Modified Rankin (Stroke Patients Only)       Balance Overall balance assessment: Needs assistance Sitting-balance support: No upper extremity supported, Feet supported Sitting balance-Leahy Scale: Fair     Standing balance support: No upper extremity supported, During functional activity Standing balance-Leahy Scale: Fair               High level balance activites: Direction changes, Head turns, Other (comment) High Level Balance Comments: WFL step over object, directional changes, head turns with increased time             Pertinent Vitals/Pain Pain Assessment Pain Assessment: Faces Faces Pain Scale: No hurt Pain Intervention(s): Monitored during session    Home Living Family/patient expects to be discharged to:: Private residence Living Arrangements: Spouse/significant other Available Help at Discharge: Family Type of Home: House Home Access: Stairs to enter   Secretary/administrator of Steps: 4   Home Layout: Able to live on main level with bedroom/bathroom Home Equipment: Agricultural consultant (2 wheels);Gilmer Mor - single point      Prior Function  Prior Level of Function : Independent/Modified Independent;History of Falls (last six months)                     Hand Dominance   Dominant Hand: Right    Extremity/Trunk Assessment   Upper Extremity Assessment Upper Extremity Assessment:  Defer to OT evaluation    Lower Extremity Assessment Lower Extremity Assessment: Generalized weakness (4/5 throughout)    Cervical / Trunk Assessment Cervical / Trunk Assessment: Normal;Other exceptions Cervical / Trunk Exceptions: history of lumbar surgery  Communication   Communication: HOH  Cognition Arousal/Alertness: Awake/alert Behavior During Therapy: WFL for tasks assessed/performed Overall Cognitive Status: Within Functional Limits for tasks assessed                                          General Comments      Exercises     Assessment/Plan    PT Assessment Patient needs continued PT services  PT Problem List Decreased strength;Decreased mobility;Decreased activity tolerance;Decreased balance;Decreased knowledge of use of DME       PT Treatment Interventions DME instruction;Therapeutic activities;Gait training;Therapeutic exercise;Patient/family education;Balance training;Stair training;Functional mobility training;Neuromuscular re-education    PT Goals (Current goals can be found in the Care Plan section)  Acute Rehab PT Goals PT Goal Formulation: With patient Time For Goal Achievement: 07/22/22 Potential to Achieve Goals: Good    Frequency Min 3X/week     Co-evaluation               AM-PAC PT "6 Clicks" Mobility  Outcome Measure Help needed turning from your back to your side while in a flat bed without using bedrails?: None Help needed moving from lying on your back to sitting on the side of a flat bed without using bedrails?: None Help needed moving to and from a bed to a chair (including a wheelchair)?: None Help needed standing up from a chair using your arms (e.g., wheelchair or bedside chair)?: A Little Help needed to walk in hospital room?: A Little Help needed climbing 3-5 steps with a railing? : A Little 6 Click Score: 21    End of Session Equipment Utilized During Treatment: Gait belt Activity Tolerance: Patient  tolerated treatment well Patient left: in bed;with call bell/phone within reach;with family/visitor present Nurse Communication: Mobility status PT Visit Diagnosis: Other abnormalities of gait and mobility (R26.89)    Time: 1610-9604 PT Time Calculation (min) (ACUTE ONLY): 31 min   Charges:   PT Evaluation $PT Eval Low Complexity: 1 Low PT Treatments $Gait Training: 8-22 mins        Marye Round, PT DPT Acute Rehabilitation Services Secure Chat Preferred  Office [phone]    Tahsin Benyo E Stroup 07/08/2022, 11:06 AM

## 2022-07-08 NOTE — Care Management Obs Status (Cosign Needed)
MEDICARE OBSERVATION STATUS NOTIFICATION   Patient Details  Name: Spencer Clark MRN: [id number] Date of Birth: 05-Jul-1940   Medicare Observation Status Notification Given:  Yes    Janae Bridgeman, RN 07/08/2022, 1:32 PM

## 2023-02-08 ENCOUNTER — Other Ambulatory Visit (HOSPITAL_COMMUNITY): Payer: Self-pay
